# Patient Record
Sex: Female | Born: 1975 | Race: Black or African American | Hispanic: No | Marital: Married | Smoking: Never smoker
Health system: Southern US, Community
[De-identification: ages and names within clinical notes are randomized; demographics above are authoritative.]

## PROBLEM LIST (undated history)

## (undated) ENCOUNTER — Ambulatory Visit: Admission: EM | Payer: Self-pay

## (undated) ENCOUNTER — Ambulatory Visit (HOSPITAL_COMMUNITY): Admission: EM | Payer: BC Managed Care – PPO

## (undated) DIAGNOSIS — F419 Anxiety disorder, unspecified: Secondary | ICD-10-CM

## (undated) DIAGNOSIS — K219 Gastro-esophageal reflux disease without esophagitis: Secondary | ICD-10-CM

## (undated) DIAGNOSIS — J302 Other seasonal allergic rhinitis: Secondary | ICD-10-CM

## (undated) HISTORY — PX: NO PAST SURGERIES: SHX2092

---

## 2007-11-03 ENCOUNTER — Other Ambulatory Visit: Admission: RE | Admit: 2007-11-03 | Discharge: 2007-11-03 | Payer: Self-pay | Admitting: Obstetrics and Gynecology

## 2009-04-17 ENCOUNTER — Other Ambulatory Visit: Admission: RE | Admit: 2009-04-17 | Discharge: 2009-04-17 | Payer: Self-pay | Admitting: Obstetrics and Gynecology

## 2010-06-02 ENCOUNTER — Other Ambulatory Visit: Admission: RE | Admit: 2010-06-02 | Discharge: 2010-06-02 | Payer: Self-pay | Admitting: Obstetrics and Gynecology

## 2011-05-31 ENCOUNTER — Emergency Department (HOSPITAL_COMMUNITY): Payer: BC Managed Care – PPO

## 2011-05-31 ENCOUNTER — Emergency Department (HOSPITAL_COMMUNITY)
Admission: EM | Admit: 2011-05-31 | Discharge: 2011-05-31 | Disposition: A | Discharge status: Home / Self Care | Payer: BC Managed Care – PPO | Attending: Emergency Medicine | Admitting: Emergency Medicine

## 2011-05-31 DIAGNOSIS — E860 Dehydration: Secondary | ICD-10-CM | POA: Insufficient documentation

## 2011-05-31 DIAGNOSIS — R42 Dizziness and giddiness: Secondary | ICD-10-CM | POA: Insufficient documentation

## 2011-05-31 DIAGNOSIS — R51 Headache: Secondary | ICD-10-CM | POA: Insufficient documentation

## 2011-05-31 DIAGNOSIS — F411 Generalized anxiety disorder: Secondary | ICD-10-CM | POA: Insufficient documentation

## 2011-05-31 DIAGNOSIS — R5381 Other malaise: Secondary | ICD-10-CM | POA: Insufficient documentation

## 2011-05-31 LAB — URINE MICROSCOPIC-ADD ON

## 2011-05-31 LAB — POCT I-STAT, CHEM 8
BUN: 13 mg/dL (ref 6–23)
Calcium, Ion: 1.24 mmol/L (ref 1.12–1.32)
Glucose, Bld: 117 mg/dL — ABNORMAL HIGH (ref 70–99)
HCT: 45 % (ref 36.0–46.0)
TCO2: 25 mmol/L (ref 0–100)

## 2011-05-31 LAB — URINALYSIS, ROUTINE W REFLEX MICROSCOPIC
Hgb urine dipstick: NEGATIVE
Nitrite: NEGATIVE
Specific Gravity, Urine: 1.017 (ref 1.005–1.030)
Urobilinogen, UA: 1 mg/dL (ref 0.0–1.0)
pH: 7 (ref 5.0–8.0)

## 2011-11-17 ENCOUNTER — Other Ambulatory Visit (HOSPITAL_COMMUNITY)
Admission: RE | Admit: 2011-11-17 | Discharge: 2011-11-17 | Disposition: A | Discharge status: Home / Self Care | Payer: BC Managed Care – PPO | Source: Ambulatory Visit | Attending: Obstetrics and Gynecology | Admitting: Obstetrics and Gynecology

## 2011-11-17 DIAGNOSIS — Z01419 Encounter for gynecological examination (general) (routine) without abnormal findings: Secondary | ICD-10-CM | POA: Insufficient documentation

## 2015-11-11 ENCOUNTER — Other Ambulatory Visit (HOSPITAL_COMMUNITY)
Admission: RE | Admit: 2015-11-11 | Discharge: 2015-11-11 | Disposition: A | Discharge status: Home / Self Care | Payer: BC Managed Care – PPO | Source: Ambulatory Visit | Attending: Obstetrics and Gynecology | Admitting: Obstetrics and Gynecology

## 2015-11-11 ENCOUNTER — Other Ambulatory Visit: Payer: Self-pay | Admitting: Obstetrics and Gynecology

## 2015-11-11 DIAGNOSIS — Z1151 Encounter for screening for human papillomavirus (HPV): Secondary | ICD-10-CM | POA: Insufficient documentation

## 2015-11-11 DIAGNOSIS — R87810 Cervical high risk human papillomavirus (HPV) DNA test positive: Secondary | ICD-10-CM | POA: Diagnosis not present

## 2015-11-11 DIAGNOSIS — Z01411 Encounter for gynecological examination (general) (routine) with abnormal findings: Secondary | ICD-10-CM | POA: Insufficient documentation

## 2015-11-13 LAB — CYTOLOGY - PAP

## 2015-12-15 ENCOUNTER — Other Ambulatory Visit: Payer: Self-pay | Admitting: Obstetrics and Gynecology

## 2016-08-03 ENCOUNTER — Other Ambulatory Visit: Payer: Self-pay | Admitting: Obstetrics and Gynecology

## 2016-08-03 ENCOUNTER — Other Ambulatory Visit (HOSPITAL_COMMUNITY)
Admission: RE | Admit: 2016-08-03 | Discharge: 2016-08-03 | Disposition: A | Discharge status: Home / Self Care | Payer: BC Managed Care – PPO | Source: Ambulatory Visit | Attending: Obstetrics and Gynecology | Admitting: Obstetrics and Gynecology

## 2016-08-03 DIAGNOSIS — Z01419 Encounter for gynecological examination (general) (routine) without abnormal findings: Secondary | ICD-10-CM | POA: Diagnosis not present

## 2016-08-05 LAB — CYTOLOGY - PAP: DIAGNOSIS: NEGATIVE

## 2017-01-31 ENCOUNTER — Other Ambulatory Visit: Payer: Self-pay | Admitting: Obstetrics and Gynecology

## 2017-01-31 ENCOUNTER — Other Ambulatory Visit (HOSPITAL_COMMUNITY)
Admission: RE | Admit: 2017-01-31 | Discharge: 2017-01-31 | Disposition: A | Discharge status: Home / Self Care | Payer: BC Managed Care – PPO | Source: Ambulatory Visit | Attending: Obstetrics and Gynecology | Admitting: Obstetrics and Gynecology

## 2017-01-31 DIAGNOSIS — Z01419 Encounter for gynecological examination (general) (routine) without abnormal findings: Secondary | ICD-10-CM | POA: Insufficient documentation

## 2017-02-02 LAB — CYTOLOGY - PAP: DIAGNOSIS: NEGATIVE

## 2017-02-18 DIAGNOSIS — Z9109 Other allergy status, other than to drugs and biological substances: Secondary | ICD-10-CM | POA: Insufficient documentation

## 2017-03-01 ENCOUNTER — Other Ambulatory Visit: Payer: Self-pay | Admitting: Obstetrics and Gynecology

## 2017-03-01 DIAGNOSIS — Z1231 Encounter for screening mammogram for malignant neoplasm of breast: Secondary | ICD-10-CM

## 2017-03-22 ENCOUNTER — Ambulatory Visit: Payer: BC Managed Care – PPO

## 2017-03-22 ENCOUNTER — Ambulatory Visit
Admission: RE | Admit: 2017-03-22 | Discharge: 2017-03-22 | Disposition: A | Discharge status: Home / Self Care | Payer: BC Managed Care – PPO | Source: Ambulatory Visit | Attending: Obstetrics and Gynecology | Admitting: Obstetrics and Gynecology

## 2017-03-22 DIAGNOSIS — Z1231 Encounter for screening mammogram for malignant neoplasm of breast: Secondary | ICD-10-CM

## 2018-04-05 NOTE — Patient Instructions (Addendum)
Your procedure is scheduled on:  Wednesday, July 24  Enter through the [employer] [facility] at: 1 PM  Pick up the phone at the desk and dial [phone].  Call this number if you have problems the morning of surgery: [phone].  Remember: Do NOT eat food after midnight Tuesday.  Do NOT drink clear liquids (including water) after 8:30 am Wednesday, day of surgery.  Take these medicines the morning of surgery with a SIP OF WATER: None  Brush your teeth on the morning of surgery.  Stop herbal medications, vitamin supplements, Ibuprofen/NSAIDS 1 week prior to surgery.  Do NOT wear jewelry (body piercing), metal hair clips/bobby pins, make-up, or nail polish. Do NOT wear lotions, powders, or perfumes.  You may wear deoderant. Do NOT shave for 48 hours prior to surgery. Do NOT bring valuables to the hospital. Contacts may not be worn into surgery.  Have a responsible adult drive you home and stay with you for 24 hours after your procedure.  Home with Husband Yolanda Patel cell [phone] or Yolanda Patel cell [phone]

## 2018-04-13 ENCOUNTER — Other Ambulatory Visit: Payer: Self-pay | Admitting: Obstetrics and Gynecology

## 2018-04-14 ENCOUNTER — Encounter (HOSPITAL_COMMUNITY)
Admission: RE | Admit: 2018-04-14 | Discharge: 2018-04-14 | Disposition: A | Discharge status: Home / Self Care | Payer: BC Managed Care – PPO | Source: Ambulatory Visit | Attending: Obstetrics and Gynecology | Admitting: Obstetrics and Gynecology

## 2018-04-14 ENCOUNTER — Other Ambulatory Visit: Payer: Self-pay

## 2018-04-14 ENCOUNTER — Encounter (HOSPITAL_COMMUNITY): Payer: Self-pay

## 2018-04-14 DIAGNOSIS — Z01812 Encounter for preprocedural laboratory examination: Secondary | ICD-10-CM | POA: Diagnosis not present

## 2018-04-14 HISTORY — DX: Other seasonal allergic rhinitis: J30.2

## 2018-04-14 HISTORY — DX: Anxiety disorder, unspecified: F41.9

## 2018-04-14 HISTORY — DX: Gastro-esophageal reflux disease without esophagitis: K21.9

## 2018-04-14 LAB — CBC
HCT: 40.6 % (ref 36.0–46.0)
Hemoglobin: 13.6 g/dL (ref 12.0–15.0)
MCH: 30.3 pg (ref 26.0–34.0)
MCHC: 33.5 g/dL (ref 30.0–36.0)
MCV: 90.4 fL (ref 78.0–100.0)
PLATELETS: 358 10*3/uL (ref 150–400)
RBC: 4.49 MIL/uL (ref 3.87–5.11)
RDW: 13.9 % (ref 11.5–15.5)
WBC: 8.3 10*3/uL (ref 4.0–10.5)

## 2018-04-18 ENCOUNTER — Other Ambulatory Visit: Payer: Self-pay | Admitting: Obstetrics and Gynecology

## 2018-04-18 NOTE — H&P (Signed)
Chief Complaint(s):   complex left ovarian cyst   HPI:  General 42 y/o presents for preop history and physical exam in preparation for laproscopic left salpingoophorectomy due to complexe mass o the left ovary... ultrasound results. her uterus measures 10 cm x x 6 cm x 6 cm. she has a fundal fibroid 3.5 cm. The left adnexa contains a complex mass 8.3 cm vs 2 sepertie masses. low level inernal echoes 5.2 cm and 5.1 cm. they are avascular. ... She is interested in future fertility. Current Medication: Taking  Junel FE 1/20(Norethin Ace-Eth Estrad-FE) 1-20 MG-MCG Tablet take 1 tablet every day Orally Once a day   Not-Taking  Sprintec 28(Norgestimate-Eth Estradiol) 0.25-35 MG-MCG Tablet 1 tablet Orally Once a day     Allegra(Fexofenadine HCl) 1 tablet as needed, Notes: 180mg  prn     Flexeril 10 mg 30 10 mg Tablets one tablet orally every 8 hours prn pain     Mirena (52 MG)(Levonorgestrel) 20 MCG/24HR Intrauterine Device as directed Intrauterine     Nasacort AQ(Triamcinolone Acetonide) , Notes: as needed     Medication List reviewed and reconciled with the patient   Medical History:  UTI's      Allergies/Intolerance:  N.K.D.A.   Gyn History:  Sexual activity not currently sexually active. Periods : irregular. LMP 02/2018. Denies Birth control ocp-Junel . Last pap smear date 01/31/2017 -neg. Last mammogram date 03/22/2017 - normal . Denies Abnormal pap smear. Denies STD. Menarche 70.   OB History:  Number of pregnancies 2. Pregnancy # 1 1998, live birth, vaginal delivery, boy. Pregnancy # 2 2003, girl, vaginal delivery, live birth. NVD 2.   Surgical History:  No Surgical History documented.   Hospitalization:  childbirth x 2 SVD   Family History:  Father: alive    Mother: alive, Lupus, hypothyroid    Paternal [place] Father: diabetes, diagnosed with Diabetes    Son(s): alive, asthma    4 brother(s) , 6 sister(s) - healthy. 1 son(s) , 1 daughter(s) .    denies any GYN family  cancer hx.  Social History: General no EXPOSURE TO PASSIVE SMOKE.  no Alcohol.  Children: 1, son (s), 1, daughter (s).  no Caffeine.  EDUCATION: College.  Tobacco use cigarettes: Never smoked, Tobacco history last updated 04/06/2018.  Marital Status: married.  no Recreational drug use.  OCCUPATION: employed, [employer] logistics/support/administrative officer).   ROS: CONSTITUTIONAL No" options="no,yes" propid="91" itemid="193425" categoryid="10464" encounterid="[id number]"Chills No. No" options="no,yes" propid="91" itemid="172899" categoryid="10464" encounterid="[id number]"Fatigue No. No" options="no,yes" propid="91" itemid="10467" categoryid="10464" encounterid="[id number]"Fever No. No" options="no,yes" propid="91" itemid="193426" categoryid="10464" encounterid="[id number]"Night sweats No. No" options="no,yes" propid="91" itemid="444261" categoryid="10464" encounterid="[id number]"Recent travel outside [place] No. No" options="no,yes" propid="91" itemid="193427" categoryid="10464" encounterid="[id number]"Sweats No. No" options="no,yes" propid="91" itemid="194825" categoryid="10464" encounterid="[id number]"Weight change No.  OPHTHALMOLOGY no" options="no,yes" propid="91" itemid="12520" categoryid="12516" encounterid="[id number]"Blurring of vision no. no" options="no,yes" propid="91" itemid="193469" categoryid="12516" encounterid="[id number]"Change in vision no. no" options="no,yes" propid="91" itemid="194379" categoryid="12516" encounterid="[id number]"Double vision no.  ENT no" options="no,yes" propid="91" itemid="193612" categoryid="10481" encounterid="[id number]"Dizziness no. Nose bleeds no. Sore throat no. Teeth pain no.  ALLERGY no" options="no,yes" propid="91" itemid="202589" categoryid="138152" encounterid="[id number]"Hives no.  CARDIOLOGY no" options="no,yes" propid="91" itemid="193603" categoryid="10488" encounterid="[id number]"Chest pain no. no" options="no,yes" propid="91" itemid="199089" categoryid="10488"  encounterid="[id number]"High blood pressure no. no" options="no,yes" propid="91" itemid="202598" categoryid="10488" encounterid="[id number]"Irregular heart beat no. no" options="no,yes" propid="91" itemid="10491" categoryid="10488" encounterid="[id number]"Leg edema no. no" options="no,yes" propid="91" itemid="10490" categoryid="10488" encounterid="[id number]"Palpitations no.  RESPIRATORY no" options="no" propid="91" itemid="270013" categoryid="138132" encounterid="[id number]"Shortness of breath no. no" options="no,yes" propid="91" itemid="172745" categoryid="138132" encounterid="[id number]"Cough no. no" options="no,yes" propid="91" itemid="193621" categoryid="138132" encounterid="[id number]"Wheezing no.  UROLOGY no" options="no,yes" propid="91" FGHWEX="937169" categoryid="138166" encounterid="[id number]"Pain with urination  no. no" options="no,yes" propid="91" itemid="193493" categoryid="138166" encounterid="[id number]"Urinary urgency no. no" options="no,yes" propid="91" itemid="193492" categoryid="138166" encounterid="[id number]"Urinary frequency no. no" options="no,yes" propid="91" itemid="138171" categoryid="138166" encounterid="[id number]"Urinary incontinence no. No" options="no,yes" propid="91" itemid="138167" categoryid="138166" encounterid="[id number]"Difficulty urinating No. No" options="no,yes" propid="91" itemid="138168" categoryid="138166" encounterid="[id number]"Blood in urine No.  GASTROENTEROLOGY no" options="no,yes" propid="91" itemid="10496" categoryid="10494" encounterid="[id number]"Abdominal pain no. no" options="no,yes" propid="91" itemid="193447" categoryid="10494" encounterid="[id number]"Appetite change no. no" options="no,yes" propid="91" itemid="193448" categoryid="10494" encounterid="[id number]"Bloating/belching no. no" options="no,yes" propid="91" itemid="10503" categoryid="10494" encounterid="[id number]"Blood in stool or on toilet paper no. no" options="no,yes" propid="91" itemid="199106" categoryid="10494"  encounterid="[id number]"Change in bowel movements no. no" options="no,yes" propid="91" itemid="10501" categoryid="10494" encounterid="[id number]"Constipation no. no" options="no,yes" propid="91" itemid="10502" categoryid="10494" encounterid="[id number]"Diarrhea no. no" options="no,yes" propid="91" itemid="199104" categoryid="10494" encounterid="[id number]"Difficulty swallowing no. no" options="no,yes" propid="91" itemid="10499" categoryid="10494" encounterid="[id number]"Nausea no.  FEMALE REPRODUCTIVE no" options="no,yes" propid="91" itemid="453725" categoryid="10525" encounterid="[id number]"Vulvar pain no. no" options="no,yes" propid="91" itemid="453726" categoryid="10525" encounterid="[id number]"Vulvar rash no. no" options="no, yes" propid="91" itemid="444315" categoryid="10525" encounterid="[id number]"Abnormal vaginal bleeding no. no" options="no,yes" propid="91" itemid="186083" categoryid="10525" encounterid="[id number]"Breast pain no. no" options="no,yes" propid="91" itemid="186084" categoryid="10525" encounterid="[id number]"Nipple discharge no. no" options="no,yes" propid="91" itemid="275823" categoryid="10525" encounterid="[id number]"Pain with intercourse no. no" options="no,yes" propid="91" itemid="186082" categoryid="10525" encounterid="[id number]"Pelvic pain no. no" options="no,yes" propid="91" itemid="278230" categoryid="10525" encounterid="[id number]"Unusual vaginal discharge no. no" options="no,yes" propid="91" itemid="278942" categoryid="10525" encounterid="[id number]"Vaginal itching no.  MUSCULOSKELETAL no" options="no,yes" propid="91" itemid="193461" categoryid="10514" encounterid="[id number]"Muscle aches no.  NEUROLOGY no" options="no,yes" propid="91" itemid="12513" categoryid="12512" encounterid="[id number]"Headache no. no" options="no,yes" propid="91" itemid="12514" categoryid="12512" encounterid="[id number]"Tingling/numbness no. no" options="no,yes" propid="91" itemid="193468" categoryid="12512"  encounterid="[id number]"Weakness no.  PSYCHOLOGY no" options="" propid="91" itemid="275919" categoryid="10520" encounterid="[id number]"Depression no. no" options="no,yes" propid="91" itemid="172748" categoryid="10520" encounterid="[id number]"Anxiety no. no" options="no,yes" propid="91" itemid="199158" categoryid="10520" encounterid="10559303"Nervousness no. no" options="no,yes" propid="91" itemid="12502" categoryid="10520" encounterid="10559303"Sleep disturbances no. no " options="no,yes" propid="91" itemid="72718" categoryid="10520" encounterid="10559303"Suicidal ideation no .  ENDOCRINOLOGY no" options="no,yes" propid="91" itemid="194628" categoryid="12508" encounterid="10559303"Excessive thirst no. no" options="no,yes" propid="91" itemid="196285" categoryid="12508" encounterid="10559303"Excessive urination no. no" options="no, yes" propid="91" itemid="444314" categoryid="12508" encounterid="[id number]"Hair loss no. no" options="" propid="91" itemid="447284" categoryid="12508" encounterid="10559303"Heat or cold intolerance no.  HEMATOLOGY/LYMPH no" options="no,yes" propid="91" itemid="199152" categoryid="138157" encounterid="10559303"Abnormal bleeding no. no" options="no,yes" propid="91" itemid="170653" categoryid="138157" encounterid="10559303"Easy bruising no. no" options="no,yes" propid="91" itemid="138158" categoryid="138157" encounterid="10559303"Swollen glands no.  DERMATOLOGY no" options="no,yes" propid="91" itemid="199126" categoryid="12503" encounterid="10559303"New/changing skin lesion no. no" options="no,yes" propid="91" itemid="12504" categoryid="12503" encounterid="[id number]"Rash no. no" options="" propid="91" itemid="444313" categoryid="12503" encounterid="[id number]"Sores no.   Negative except as stated in HPI.  Objective: Vitals: Wt 159, Wt change -6 lb, Ht 62.5, BMI 28.61, Pulse sitting 90, BP sitting 102/60  Past Results: Examination:  General Examination alert, oriented, NAD "  categoryPropId="10089" examid="193638"CONSTITUTIONAL: alert, oriented, NAD .  moist, warm" categoryPropId="10109" examid="193638"SKIN: moist, warm.  Conjunctiva clear" categoryPropId="21468" examid="193638"EYES: Conjunctiva clear.  clear to auscultation bilaterally" categoryPropId="87" examid="193638"LUNGS: clear to auscultation bilaterally.  regular rate and rhythm" categoryPropId="86" examid="193638"HEART: regular rate and rhythm.  soft, non-tender/non-distended, bowel sounds present " categoryPropId="88" examid="193638"ABDOMEN: soft, non-tender/non-distended, bowel sounds present .  normal external genitalia, labia - unremarkable, vagina - pink moist mucosa, no lesions or abnormal discharge, cervix - no discharge or lesions or CMT, adnexa - no masses or tenderness, uterus - nontender and normal size on palpation " categoryPropId="13414" examid="193638"FEMALE GENITOURINARY: normal external genitalia, labia - unremarkable, vagina - pink moist mucosa, no lesions or abnormal discharge, cervix - no discharge or lesions or CMT, adnexa - no masses or tenderness, uterus - nontender and normal size on palpation .  affect normal, good eye contact" categoryPropId="16316" examid="193638"PSYCH: affect normal, good eye contact.  Physical Examination:    Assessment: Assessment:  Ovarian cyst - N83.209 (Primary)     Plan: Treatment: Ovarian cyst Notes: large complex ovarian cyst... d/w pt r/b/a of laparoscpic left salpingoophorectomy .. including but not limited to infection bleeding damage to bowel bladder ureters  with the need for further surgery. pt voiced understanding and desires to proceed.Marland Kitchen

## 2018-04-18 NOTE — H&P (Deleted)
  The note originally documented on this encounter has been moved the the encounter in which it belongs.  

## 2018-04-19 ENCOUNTER — Encounter (HOSPITAL_COMMUNITY): Payer: Self-pay

## 2018-04-19 ENCOUNTER — Ambulatory Visit (HOSPITAL_COMMUNITY): Payer: BC Managed Care – PPO | Admitting: Anesthesiology

## 2018-04-19 ENCOUNTER — Ambulatory Visit (HOSPITAL_COMMUNITY)
Admission: AD | Admit: 2018-04-19 | Discharge: 2018-04-19 | Disposition: A | Discharge status: Home / Self Care | Payer: BC Managed Care – PPO | Source: Ambulatory Visit | Attending: Obstetrics and Gynecology | Admitting: Obstetrics and Gynecology

## 2018-04-19 ENCOUNTER — Encounter (HOSPITAL_COMMUNITY)
Admission: AD | Disposition: A | Discharge status: Home / Self Care | Payer: Self-pay | Source: Ambulatory Visit | Attending: Obstetrics and Gynecology

## 2018-04-19 ENCOUNTER — Other Ambulatory Visit: Payer: Self-pay

## 2018-04-19 DIAGNOSIS — Z7951 Long term (current) use of inhaled steroids: Secondary | ICD-10-CM | POA: Diagnosis not present

## 2018-04-19 DIAGNOSIS — D271 Benign neoplasm of left ovary: Secondary | ICD-10-CM | POA: Diagnosis not present

## 2018-04-19 DIAGNOSIS — Z79899 Other long term (current) drug therapy: Secondary | ICD-10-CM | POA: Insufficient documentation

## 2018-04-19 DIAGNOSIS — N838 Other noninflammatory disorders of ovary, fallopian tube and broad ligament: Secondary | ICD-10-CM | POA: Diagnosis present

## 2018-04-19 DIAGNOSIS — Z793 Long term (current) use of hormonal contraceptives: Secondary | ICD-10-CM | POA: Diagnosis not present

## 2018-04-19 DIAGNOSIS — N839 Noninflammatory disorder of ovary, fallopian tube and broad ligament, unspecified: Secondary | ICD-10-CM | POA: Diagnosis present

## 2018-04-19 HISTORY — PX: LAPAROSCOPIC SALPINGO OOPHERECTOMY: SHX5927

## 2018-04-19 LAB — PREGNANCY, URINE: Preg Test, Ur: NEGATIVE

## 2018-04-19 SURGERY — SALPINGO-OOPHORECTOMY, LAPAROSCOPIC
Anesthesia: General | Laterality: Left

## 2018-04-19 MED ORDER — PROPOFOL 10 MG/ML IV BOLUS
INTRAVENOUS | Status: AC
  Filled 2018-04-19: qty 20

## 2018-04-19 MED ORDER — LIDOCAINE HCL (CARDIAC) PF 100 MG/5ML IV SOSY
PREFILLED_SYRINGE | INTRAVENOUS | Status: AC
  Filled 2018-04-19: qty 5

## 2018-04-19 MED ORDER — LACTATED RINGERS IV SOLN
INTRAVENOUS | Status: DC | PRN
  Administered 2018-04-19 (×2): via INTRAVENOUS

## 2018-04-19 MED ORDER — FENTANYL CITRATE (PF) 250 MCG/5ML IJ SOLN
INTRAMUSCULAR | Status: AC
  Filled 2018-04-19: qty 5

## 2018-04-19 MED ORDER — KETOROLAC TROMETHAMINE 30 MG/ML IJ SOLN
INTRAMUSCULAR | Status: DC | PRN
  Administered 2018-04-19: 30 mg via INTRAVENOUS

## 2018-04-19 MED ORDER — LACTATED RINGERS IV SOLN
INTRAVENOUS | Status: DC
  Administered 2018-04-19: 125 mL/h via INTRAVENOUS

## 2018-04-19 MED ORDER — BUPIVACAINE HCL (PF) 0.25 % IJ SOLN
INTRAMUSCULAR | Status: DC | PRN
  Administered 2018-04-19: 30 mL

## 2018-04-19 MED ORDER — OXYCODONE HCL 5 MG PO TABS: 5.0000 mg | ORAL_TABLET | Freq: Once | ORAL | Status: DC | PRN

## 2018-04-19 MED ORDER — PHENYLEPHRINE 40 MCG/ML (10ML) SYRINGE FOR IV PUSH (FOR BLOOD PRESSURE SUPPORT)
PREFILLED_SYRINGE | INTRAVENOUS | Status: DC | PRN
  Administered 2018-04-19: 40 ug via INTRAVENOUS
  Administered 2018-04-19 (×2): 80 ug via INTRAVENOUS
  Administered 2018-04-19 (×2): 40 ug via INTRAVENOUS
  Administered 2018-04-19: 80 ug via INTRAVENOUS
  Administered 2018-04-19 (×3): 40 ug via INTRAVENOUS
  Administered 2018-04-19: 80 ug via INTRAVENOUS

## 2018-04-19 MED ORDER — CEFAZOLIN SODIUM-DEXTROSE 2-4 GM/100ML-% IV SOLN
INTRAVENOUS | Status: AC
  Filled 2018-04-19: qty 100

## 2018-04-19 MED ORDER — DEXAMETHASONE SODIUM PHOSPHATE 10 MG/ML IJ SOLN
INTRAMUSCULAR | Status: DC | PRN
  Administered 2018-04-19: 10 mg via INTRAVENOUS

## 2018-04-19 MED ORDER — MIDAZOLAM HCL 2 MG/2ML IJ SOLN
INTRAMUSCULAR | Status: DC | PRN
  Administered 2018-04-19: 2 mg via INTRAVENOUS

## 2018-04-19 MED ORDER — PHENYLEPHRINE 40 MCG/ML (10ML) SYRINGE FOR IV PUSH (FOR BLOOD PRESSURE SUPPORT)
PREFILLED_SYRINGE | INTRAVENOUS | Status: AC
  Filled 2018-04-19: qty 10

## 2018-04-19 MED ORDER — MIDAZOLAM HCL 2 MG/2ML IJ SOLN
INTRAMUSCULAR | Status: AC
  Filled 2018-04-19: qty 2

## 2018-04-19 MED ORDER — ONDANSETRON HCL 4 MG/2ML IJ SOLN: 4.0000 mg | Freq: Four times a day (QID) | INTRAMUSCULAR | Status: DC | PRN

## 2018-04-19 MED ORDER — ROCURONIUM BROMIDE 100 MG/10ML IV SOLN
INTRAVENOUS | Status: DC | PRN
  Administered 2018-04-19: 50 mg via INTRAVENOUS

## 2018-04-19 MED ORDER — SCOPOLAMINE 1 MG/3DAYS TD PT72
MEDICATED_PATCH | TRANSDERMAL | Status: AC
  Administered 2018-04-19: 1.5 mg via TRANSDERMAL
  Filled 2018-04-19: qty 1

## 2018-04-19 MED ORDER — PROPOFOL 10 MG/ML IV BOLUS
INTRAVENOUS | Status: DC | PRN
  Administered 2018-04-19: 200 mg via INTRAVENOUS
  Administered 2018-04-19: 30 mg via INTRAVENOUS

## 2018-04-19 MED ORDER — OXYCODONE HCL 5 MG/5ML PO SOLN: 5.0000 mg | Freq: Once | ORAL | Status: DC | PRN

## 2018-04-19 MED ORDER — FENTANYL CITRATE (PF) 100 MCG/2ML IJ SOLN
INTRAMUSCULAR | Status: DC | PRN
  Administered 2018-04-19: 50 ug via INTRAVENOUS
  Administered 2018-04-19: 100 ug via INTRAVENOUS
  Administered 2018-04-19: 50 ug via INTRAVENOUS

## 2018-04-19 MED ORDER — BUPIVACAINE HCL (PF) 0.25 % IJ SOLN
INTRAMUSCULAR | Status: AC
  Filled 2018-04-19: qty 30

## 2018-04-19 MED ORDER — KETOROLAC TROMETHAMINE 30 MG/ML IJ SOLN
INTRAMUSCULAR | Status: AC
  Filled 2018-04-19: qty 1

## 2018-04-19 MED ORDER — ONDANSETRON HCL 4 MG/2ML IJ SOLN
INTRAMUSCULAR | Status: AC
  Filled 2018-04-19: qty 2

## 2018-04-19 MED ORDER — HYDROCODONE-ACETAMINOPHEN 5-325 MG PO TABS: 1.0000 | ORAL_TABLET | Freq: Four times a day (QID) | ORAL | 0 refills | Status: DC | PRN

## 2018-04-19 MED ORDER — ONDANSETRON HCL 4 MG/2ML IJ SOLN
INTRAMUSCULAR | Status: DC | PRN
  Administered 2018-04-19: 4 mg via INTRAVENOUS

## 2018-04-19 MED ORDER — FENTANYL CITRATE (PF) 100 MCG/2ML IJ SOLN
INTRAMUSCULAR | Status: AC
  Filled 2018-04-19: qty 2

## 2018-04-19 MED ORDER — CEFAZOLIN SODIUM-DEXTROSE 2-4 GM/100ML-% IV SOLN
2.0000 g | INTRAVENOUS | Status: AC
  Administered 2018-04-19: 2 g via INTRAVENOUS

## 2018-04-19 MED ORDER — SUGAMMADEX SODIUM 200 MG/2ML IV SOLN
INTRAVENOUS | Status: DC | PRN
  Administered 2018-04-19: 200 mg via INTRAVENOUS

## 2018-04-19 MED ORDER — IBUPROFEN 800 MG PO TABS: 800.0000 mg | ORAL_TABLET | Freq: Three times a day (TID) | ORAL | 0 refills | Status: DC | PRN

## 2018-04-19 MED ORDER — SCOPOLAMINE 1 MG/3DAYS TD PT72
1.0000 | MEDICATED_PATCH | Freq: Once | TRANSDERMAL | Status: DC
  Administered 2018-04-19: 1.5 mg via TRANSDERMAL

## 2018-04-19 MED ORDER — LIDOCAINE HCL (CARDIAC) PF 100 MG/5ML IV SOSY
PREFILLED_SYRINGE | INTRAVENOUS | Status: DC | PRN
  Administered 2018-04-19: 60 mg via INTRAVENOUS

## 2018-04-19 MED ORDER — FENTANYL CITRATE (PF) 100 MCG/2ML IJ SOLN
25.0000 ug | INTRAMUSCULAR | Status: DC | PRN
  Administered 2018-04-19: 50 ug via INTRAVENOUS

## 2018-04-19 SURGICAL SUPPLY — 28 items
CABLE HIGH FREQUENCY MONO STRZ (ELECTRODE) IMPLANT
DERMABOND ADHESIVE PROPEN (GAUZE/BANDAGES/DRESSINGS) ×1
DERMABOND ADVANCED .7 DNX6 (GAUZE/BANDAGES/DRESSINGS) ×1 IMPLANT
DRSG OPSITE POSTOP 3X4 (GAUZE/BANDAGES/DRESSINGS) ×2 IMPLANT
DURAPREP 26ML APPLICATOR (WOUND CARE) ×2 IMPLANT
GLOVE BIOGEL M 6.5 STRL (GLOVE) ×4 IMPLANT
GLOVE BIOGEL PI IND STRL 6.5 (GLOVE) ×1 IMPLANT
GLOVE BIOGEL PI IND STRL 7.0 (GLOVE) ×3 IMPLANT
GLOVE BIOGEL PI INDICATOR 6.5 (GLOVE) ×1
GLOVE BIOGEL PI INDICATOR 7.0 (GLOVE) ×3
GOWN STRL REUS W/TWL LRG LVL3 (GOWN DISPOSABLE) ×4 IMPLANT
NS IRRIG 1000ML POUR BTL (IV SOLUTION) ×2 IMPLANT
PACK LAPAROSCOPY BASIN (CUSTOM PROCEDURE TRAY) ×2 IMPLANT
PACK TRENDGUARD 450 HYBRID PRO (MISCELLANEOUS) ×1 IMPLANT
POUCH SPECIMEN RETRIEVAL 10MM (ENDOMECHANICALS) ×2 IMPLANT
PROTECTOR NERVE ULNAR (MISCELLANEOUS) ×4 IMPLANT
SET IRRIG TUBING LAPAROSCOPIC (IRRIGATION / IRRIGATOR) ×2 IMPLANT
SHEARS HARMONIC ACE PLUS 36CM (ENDOMECHANICALS) ×2 IMPLANT
SOLUTION ELECTROLUBE (MISCELLANEOUS) ×2 IMPLANT
SUT VIC AB 4-0 PS2 27 (SUTURE) ×2 IMPLANT
SUT VICRYL 0 UR6 27IN ABS (SUTURE) ×2 IMPLANT
TOWEL OR 17X24 6PK STRL BLUE (TOWEL DISPOSABLE) ×4 IMPLANT
TRAY FOLEY W/BAG SLVR 14FR (SET/KITS/TRAYS/PACK) ×2 IMPLANT
TRENDGUARD 450 HYBRID PRO PACK (MISCELLANEOUS) ×2
TROCAR XCEL NON-BLD 11X100MML (ENDOMECHANICALS) ×2 IMPLANT
TROCAR XCEL NON-BLD 5MMX100MML (ENDOMECHANICALS) ×2 IMPLANT
TUBING INSUF HEATED (TUBING) ×2 IMPLANT
WARMER LAPAROSCOPE (MISCELLANEOUS) ×2 IMPLANT

## 2018-04-19 NOTE — Op Note (Signed)
04/19/2018  4:01 PM  PATIENT:  Yolanda Patel  42 y.o. female  PRE-OPERATIVE DIAGNOSIS:  N94.9 Adnexal Mass  POST-OPERATIVE DIAGNOSIS:  N94.9 Adnexal Mass  PROCEDURE:  Procedure(s): LAPAROSCOPIC LEFT SALPINGO OOPHORECTOMY (Left)  SURGEON:  Surgeon(s) and Role:    Christophe Louis, MD - Primary    * Thurnell Lose, MD - Assisting  PHYSICIAN ASSISTANT:   ASSISTANTS: Dr. Simona Huh assisted due to the complexity of the surgery   ANESTHESIA:   general  EBL:  100 mL   BLOOD ADMINISTERED:none  DRAINS: Urinary Catheter (Foley)   LOCAL MEDICATIONS USED:  MARCAINE     SPECIMEN:  Source of Specimen:  left fallopian tube and ovary with mass   DISPOSITION OF SPECIMEN:  PATHOLOGY  COUNTS:  YES  TOURNIQUET:  * No tourniquets in log *  DICTATION: .Dragon Dictation  PLAN OF CARE: Discharge to home after PACU  PATIENT DISPOSITION:  PACU - hemodynamically stable.   Delay start of Pharmacological VTE agent (>24hrs) due to surgical blood loss or risk of bleeding: not applicable  Procedure: Procedure: the patient was taken to the operating room placed under general anesthesia. Time Out was performed.  She was  Prepped and draped in the normal sterile fashion. A foley catheter was placed. A uterine manipulator was placed. Attention was turned to the abdomen where the umbilicus was injected with 10 cc of marcaine. A 10 mm trocar was placed under direct visualization. Pneumoperitoneum was achieved with C02 gas... A 5 mm trocar was placed in the right and left lower quadrants. Each trocar site was injected with 10 cc of marcaine prior to trocar placement. Pelvic washing were obtained.  The harmonic scalpel was used to incise along the left infundibulopelvic ligament   And the left utero-ovarian ligament.  An endo catch bag was placed through the 10 mm umbilical port. The specimen was placed in the bag and removed through the umbilical incision. Pneumoperitoneum was reestablished. Pt was noted to have  bleeding from the left pelvic side wall and utero ovarian ligament. this was controlled with the Kleppinger.  The pelvis was irrigated. Arista was placed along the left pelvic side wale  for additional hemostasis. Excellent hemostasis was noted. All trocars were removed under direct visualization . The pneumoperitoneum was released.  The fascia of the umbilical incision was re approximated with 0 vicryl. The 5 mm  skin incisions were closed with 4-0 vicryl and derma bond.  the patient was taken to the recovery room awake and in stable condition.  Sponge lap and needle counts were correct times 2.    Findings: complex left ovarian cyst with components compatible with dermoid ( fat and hair noted)... Normal left fallopian tube.. Normal right fallopian tube and ovary.

## 2018-04-19 NOTE — Transfer of Care (Signed)
Immediate Anesthesia Transfer of Care Note  Patient: Yolanda Patel  Procedure(s) Performed: LAPAROSCOPIC LEFT SALPINGO OOPHORECTOMY (Left )  Patient Location: PACU  Anesthesia Type:General  Level of Consciousness: sedated  Airway & Oxygen Therapy: Patient Spontanous Breathing and Patient connected to nasal cannula oxygen  Post-op Assessment: Report given to RN  Post vital signs: Reviewed and stable  Last Vitals:  Vitals Value Taken Time  BP 128/66 04/19/2018  3:49 PM  Temp 37 C 04/19/2018  3:45 PM  Pulse 80 04/19/2018  3:54 PM  Resp 21 04/19/2018  3:54 PM  SpO2 100 % 04/19/2018  3:54 PM  Vitals shown include unvalidated device data.  Last Pain:  Vitals:   04/19/18 1257  TempSrc: Oral      Patients Stated Pain Goal: 4 (38/75/64 3329)  Complications: No apparent anesthesia complications

## 2018-04-19 NOTE — Anesthesia Preprocedure Evaluation (Signed)
Anesthesia Evaluation  Patient identified by MRN, date of birth, ID band Patient awake    Reviewed: Allergy & Precautions, H&P , NPO status , Patient's Chart, lab work & pertinent test results  Airway Mallampati: II   Neck ROM: full    Dental   Pulmonary neg pulmonary ROS,    breath sounds clear to auscultation       Cardiovascular negative cardio ROS   Rhythm:regular Rate:Normal     Neuro/Psych PSYCHIATRIC DISORDERS Anxiety    GI/Hepatic GERD  ,  Endo/Other    Renal/GU      Musculoskeletal   Abdominal   Peds  Hematology   Anesthesia Other Findings   Reproductive/Obstetrics Adnexal mass                             Anesthesia Physical Anesthesia Plan  ASA: II  Anesthesia Plan: General   Post-op Pain Management:    Induction: Intravenous  PONV Risk Score and Plan: 3 and Ondansetron, Dexamethasone, Midazolam, Scopolamine patch - Pre-op and Treatment may vary due to age or medical condition  Airway Management Planned: Oral ETT  Additional Equipment:   Intra-op Plan:   Post-operative Plan: Extubation in OR  Informed Consent: I have reviewed the patients History and Physical, chart, labs and discussed the procedure including the risks, benefits and alternatives for the proposed anesthesia with the patient or authorized representative who has indicated his/her understanding and acceptance.     Plan Discussed with: CRNA, Surgeon and Anesthesiologist  Anesthesia Plan Comments:         Anesthesia Quick Evaluation

## 2018-04-19 NOTE — Anesthesia Procedure Notes (Signed)
Procedure Name: Intubation Date/Time: 04/19/2018 2:06 PM Performed by: Asher Muir, CRNA Pre-anesthesia Checklist: Patient identified, Emergency Drugs available, Suction available and Patient being monitored Patient Re-evaluated:Patient Re-evaluated prior to induction Oxygen Delivery Method: Circle system utilized and Simple face mask Preoxygenation: Pre-oxygenation with 100% oxygen Induction Type: IV induction Ventilation: Mask ventilation without difficulty Laryngoscope Size: Mac and 3 Grade View: Grade II Tube type: Oral Tube size: 7.0 mm Number of attempts: 1 Airway Equipment and Method: Stylet Placement Confirmation: ETT inserted through vocal cords under direct vision,  positive ETCO2 and breath sounds checked- equal and bilateral Secured at: 20 (right lip) cm Tube secured with: Tape Dental Injury: Teeth and Oropharynx as per pre-operative assessment

## 2018-04-19 NOTE — H&P (Signed)
Date of Initial H&P:04/18/2018 History reviewed, patient examined, no change in status, stable for surgery.

## 2018-04-19 NOTE — Discharge Instructions (Signed)

## 2018-04-20 ENCOUNTER — Encounter (HOSPITAL_COMMUNITY): Payer: Self-pay | Admitting: Obstetrics and Gynecology

## 2018-04-20 NOTE — Anesthesia Postprocedure Evaluation (Signed)
Anesthesia Post Note  Patient: Yolanda Patel  Procedure(s) Performed: LAPAROSCOPIC LEFT SALPINGO OOPHORECTOMY (Left )     Patient location during evaluation: PACU Anesthesia Type: General Level of consciousness: awake and alert Pain management: pain level controlled Vital Signs Assessment: post-procedure vital signs reviewed and stable Respiratory status: spontaneous breathing, nonlabored ventilation, respiratory function stable and patient connected to nasal cannula oxygen Cardiovascular status: blood pressure returned to baseline and stable Postop Assessment: no apparent nausea or vomiting Anesthetic complications: no    Last Vitals:  Vitals:   04/19/18 1715 04/19/18 1740  BP:  118/74  Pulse:  86  Resp:  16  Temp:  36.8 C  SpO2: 100% 100%    Last Pain:  Vitals:   04/19/18 1740  TempSrc:   PainSc: 2                  Gianluca Chhim S

## 2018-05-05 ENCOUNTER — Other Ambulatory Visit: Payer: Self-pay | Admitting: Obstetrics and Gynecology

## 2018-05-05 DIAGNOSIS — Z1231 Encounter for screening mammogram for malignant neoplasm of breast: Secondary | ICD-10-CM

## 2018-06-01 ENCOUNTER — Ambulatory Visit
Admission: RE | Admit: 2018-06-01 | Discharge: 2018-06-01 | Disposition: A | Discharge status: Home / Self Care | Payer: BC Managed Care – PPO | Source: Ambulatory Visit | Attending: Obstetrics and Gynecology | Admitting: Obstetrics and Gynecology

## 2018-06-01 DIAGNOSIS — Z1231 Encounter for screening mammogram for malignant neoplasm of breast: Secondary | ICD-10-CM

## 2019-04-10 ENCOUNTER — Other Ambulatory Visit: Payer: Self-pay | Admitting: Obstetrics and Gynecology

## 2019-04-10 ENCOUNTER — Other Ambulatory Visit (HOSPITAL_COMMUNITY)
Admission: RE | Admit: 2019-04-10 | Discharge: 2019-04-10 | Disposition: A | Discharge status: Home / Self Care | Payer: BC Managed Care – PPO | Source: Ambulatory Visit | Attending: Obstetrics and Gynecology | Admitting: Obstetrics and Gynecology

## 2019-04-10 DIAGNOSIS — Z01419 Encounter for gynecological examination (general) (routine) without abnormal findings: Secondary | ICD-10-CM | POA: Diagnosis present

## 2019-04-12 LAB — CYTOLOGY - PAP
Diagnosis: NEGATIVE
HPV: NOT DETECTED

## 2019-04-23 ENCOUNTER — Other Ambulatory Visit: Payer: Self-pay | Admitting: Obstetrics and Gynecology

## 2019-04-23 DIAGNOSIS — Z1231 Encounter for screening mammogram for malignant neoplasm of breast: Secondary | ICD-10-CM

## 2019-04-26 ENCOUNTER — Other Ambulatory Visit: Payer: Self-pay

## 2019-04-26 DIAGNOSIS — Z20822 Contact with and (suspected) exposure to covid-19: Secondary | ICD-10-CM

## 2019-04-29 LAB — NOVEL CORONAVIRUS, NAA: SARS-CoV-2, NAA: NOT DETECTED

## 2019-06-07 ENCOUNTER — Other Ambulatory Visit: Payer: Self-pay

## 2019-06-07 ENCOUNTER — Ambulatory Visit
Admission: RE | Admit: 2019-06-07 | Discharge: 2019-06-07 | Disposition: A | Discharge status: Home / Self Care | Payer: BC Managed Care – PPO | Source: Ambulatory Visit | Attending: Obstetrics and Gynecology | Admitting: Obstetrics and Gynecology

## 2019-06-07 DIAGNOSIS — Z1231 Encounter for screening mammogram for malignant neoplasm of breast: Secondary | ICD-10-CM

## 2020-07-29 ENCOUNTER — Other Ambulatory Visit: Payer: Self-pay | Admitting: Obstetrics and Gynecology

## 2020-07-29 DIAGNOSIS — Z1231 Encounter for screening mammogram for malignant neoplasm of breast: Secondary | ICD-10-CM

## 2020-08-01 ENCOUNTER — Other Ambulatory Visit: Payer: Self-pay

## 2020-08-01 ENCOUNTER — Ambulatory Visit
Admission: RE | Admit: 2020-08-01 | Discharge: 2020-08-01 | Disposition: A | Discharge status: Home / Self Care | Payer: BC Managed Care – PPO | Source: Ambulatory Visit

## 2020-08-01 DIAGNOSIS — Z1231 Encounter for screening mammogram for malignant neoplasm of breast: Secondary | ICD-10-CM

## 2020-11-15 DIAGNOSIS — Z0000 Encounter for general adult medical examination without abnormal findings: Secondary | ICD-10-CM | POA: Insufficient documentation

## 2021-05-22 ENCOUNTER — Other Ambulatory Visit: Payer: Self-pay

## 2021-05-22 ENCOUNTER — Emergency Department (HOSPITAL_COMMUNITY)
Admission: EM | Admit: 2021-05-22 | Discharge: 2021-05-23 | Disposition: A | Discharge status: Home / Self Care | Payer: BC Managed Care – PPO | Attending: Emergency Medicine | Admitting: Emergency Medicine

## 2021-05-22 ENCOUNTER — Emergency Department (HOSPITAL_COMMUNITY): Payer: BC Managed Care – PPO

## 2021-05-22 ENCOUNTER — Encounter (HOSPITAL_COMMUNITY): Payer: Self-pay | Admitting: Emergency Medicine

## 2021-05-22 DIAGNOSIS — M25511 Pain in right shoulder: Secondary | ICD-10-CM | POA: Insufficient documentation

## 2021-05-22 DIAGNOSIS — M542 Cervicalgia: Secondary | ICD-10-CM | POA: Insufficient documentation

## 2021-05-22 DIAGNOSIS — R200 Anesthesia of skin: Secondary | ICD-10-CM | POA: Insufficient documentation

## 2021-05-22 DIAGNOSIS — M5412 Radiculopathy, cervical region: Secondary | ICD-10-CM

## 2021-05-22 DIAGNOSIS — R202 Paresthesia of skin: Secondary | ICD-10-CM

## 2021-05-22 NOTE — ED Triage Notes (Signed)
On August 5th, pt was having her hair done and heard a pop that came from her right shoulder area.  Pt states since then she has had pain in right arm/shoulder that goes to her shoulder blade.  Pt also reports pain and numbness when she lays on her right side.  No neuro deficits at this time. "Hurts more when it is pressed." Pt is anxious in triage.

## 2021-05-23 ENCOUNTER — Emergency Department (HOSPITAL_COMMUNITY): Payer: BC Managed Care – PPO

## 2021-05-23 ENCOUNTER — Encounter (HOSPITAL_COMMUNITY): Payer: Self-pay | Admitting: Radiology

## 2021-05-23 LAB — BASIC METABOLIC PANEL
Anion gap: 8 (ref 5–15)
BUN: 16 mg/dL (ref 6–20)
CO2: 22 mmol/L (ref 22–32)
Calcium: 9.1 mg/dL (ref 8.9–10.3)
Chloride: 107 mmol/L (ref 98–111)
Creatinine, Ser: 0.99 mg/dL (ref 0.44–1.00)
GFR, Estimated: >60 mL/min (ref >60)
Glucose, Bld: 104 mg/dL — ABNORMAL HIGH (ref 70–99)
Potassium: 4.1 mmol/L (ref 3.5–5.1)
Sodium: 137 mmol/L (ref 135–145)

## 2021-05-23 LAB — CBC WITH DIFFERENTIAL/PLATELET
Abs Immature Granulocytes: 0.03 10*3/uL (ref 0.00–0.07)
Basophils Absolute: 0.1 10*3/uL (ref 0.0–0.1)
Basophils Relative: 0 %
Eosinophils Absolute: 0.4 10*3/uL (ref 0.0–0.5)
Eosinophils Relative: 4 %
HCT: 40.9 % (ref 36.0–46.0)
Hemoglobin: 13.4 g/dL (ref 12.0–15.0)
Immature Granulocytes: 0 %
Lymphocytes Relative: 39 %
Lymphs Abs: 4.4 10*3/uL — ABNORMAL HIGH (ref 0.7–4.0)
MCH: 30 pg (ref 26.0–34.0)
MCHC: 32.8 g/dL (ref 30.0–36.0)
MCV: 91.7 fL (ref 80.0–100.0)
Monocytes Absolute: 0.9 10*3/uL (ref 0.1–1.0)
Monocytes Relative: 8 %
Neutro Abs: 5.5 10*3/uL (ref 1.7–7.7)
Neutrophils Relative %: 49 %
Platelets: 331 10*3/uL (ref 150–400)
RBC: 4.46 MIL/uL (ref 3.87–5.11)
RDW: 14 % (ref 11.5–15.5)
WBC: 11.1 10*3/uL — ABNORMAL HIGH (ref 4.0–10.5)
nRBC: 0 % (ref 0.0–0.2)

## 2021-05-23 LAB — MAGNESIUM: Magnesium: 2.1 mg/dL (ref 1.7–2.4)

## 2021-05-23 LAB — POC URINE PREG, ED: Preg Test, Ur: NEGATIVE

## 2021-05-23 MED ORDER — SODIUM CHLORIDE 0.9 % IV BOLUS
1000.0000 mL | Freq: Once | INTRAVENOUS | Status: AC
  Administered 2021-05-23: 1000 mL via INTRAVENOUS

## 2021-05-23 MED ORDER — IOHEXOL 350 MG/ML SOLN
75.0000 mL | Freq: Once | INTRAVENOUS | Status: AC | PRN
  Administered 2021-05-23: 75 mL via INTRAVENOUS

## 2021-05-23 NOTE — ED Provider Notes (Signed)
[facility] EMERGENCY DEPARTMENT Provider Note   CSN: [id number] Arrival date & time: 05/22/21  2210     History Chief Complaint  Patient presents with   Numbness   Shoulder Pain    Yolanda Patel is a 45 y.o. female.  HPI 45 year old female presents with right neck pain and right face and arm numbness.  She states this originally started when she was getting her hair done earlier this month.  She had her neck twisted at an awkward angle and felt a pop.  Since then she has noticed on and off numbness in her right face and right arm that goes to her elbow.  Seems to come and go, most often with certain movements like bending her neck to the right.  There is no associated headache.  She has also been noticing some increased orbs floating in her vision in the bilateral lower lateral quadrants.  This comes and goes and not always with the numbness.  Seem to be worsening so she came in for evaluation.  Right now her neck does not hurt and she has no current numbness.  She has not had any weakness.  Past Medical History:  Diagnosis Date   Anxiety    panic attacks - no med   GERD (gastroesophageal reflux disease)    otc med - tums prn   Seasonal allergies    SVD (spontaneous vaginal delivery)    x 2 - both in [place]    Patient Active Problem List   Diagnosis Date Noted   Ovarian mass, left 04/19/2018    Past Surgical History:  Procedure Laterality Date   LAPAROSCOPIC SALPINGO OOPHERECTOMY Left 04/19/2018   Procedure: LAPAROSCOPIC LEFT SALPINGO OOPHORECTOMY;  Surgeon: Christophe Louis, MD;  Location: [facility];  Service: Gynecology;  Laterality: Left;   NO PAST SURGERIES       OB History   No obstetric history on file.     No family history on file.  Social History   Tobacco Use   Smoking status: Never   Smokeless tobacco: Never  Vaping Use   Vaping Use: Never used  Substance Use Topics   Alcohol use: Yes    Comment: occasional   Drug use: Never    Home  Medications Prior to Admission medications   Medication Sig Start Date End Date Taking? Authorizing Provider  calcium carbonate (TUMS - DOSED IN MG ELEMENTAL CALCIUM) 500 MG chewable tablet Chew 2 tablets by mouth as needed for indigestion or heartburn.    [provider]  fexofenadine (ALLEGRA) 180 MG tablet Take 180 mg by mouth daily as needed for allergies.    [provider]  HYDROcodone-acetaminophen (NORCO/VICODIN) 5-325 MG tablet Take 1-2 tablets by mouth every 6 (six) hours as needed for moderate pain. 04/19/18   Christophe Louis, MD  HYDROCORTISONE EX Apply 1 application topically daily as needed (for itching/rash).    [provider]  ibuprofen (ADVIL,MOTRIN) 800 MG tablet Take 1 tablet (800 mg total) by mouth every 8 (eight) hours as needed. 04/19/18   Christophe Louis, MD  phenylephrine (SUDAFED PE) 10 MG TABS tablet Take 10 mg by mouth every 4 (four) hours as needed (for sinus/allergies.).    [provider]    Allergies    Patient has no known allergies.  Review of Systems   Review of Systems  Eyes:  Positive for visual disturbance.  Musculoskeletal:  Positive for neck pain.  Neurological:  Positive for numbness. Negative for weakness and headaches.  All other systems reviewed and are negative.  Physical Exam Updated Vital Signs BP 127/82   Pulse (!) 108   Temp 99.1 F (37.3 C) (Oral)   Resp 18   Ht '5\' 3"'$  (1.6 m)   Wt 70.3 kg   SpO2 100%   BMI 27.46 kg/m   Physical Exam Vitals and nursing note reviewed.  Constitutional:      Appearance: She is well-developed.  HENT:     Head: Normocephalic and atraumatic.     Right Ear: External ear normal.     Left Ear: External ear normal.     Nose: Nose normal.  Eyes:     General:        Right eye: No discharge.        Left eye: No discharge.     Extraocular Movements: Extraocular movements intact.     Pupils: Pupils are equal, round, and reactive to light.     Comments: Normal visual field  testing  Neck:   Cardiovascular:     Rate and Rhythm: Normal rate and regular rhythm.     Pulses:          Radial pulses are 2+ on the right side and 2+ on the left side.     Heart sounds: Normal heart sounds.  Pulmonary:     Effort: Pulmonary effort is normal.     Breath sounds: Normal breath sounds.  Abdominal:     Palpations: Abdomen is soft.     Tenderness: There is no abdominal tenderness.  Musculoskeletal:     Cervical back: Normal range of motion and neck supple. Muscular tenderness present. No spinous process tenderness.  Skin:    General: Skin is warm and dry.  Neurological:     Mental Status: She is alert.     Comments: CN 3-12 grossly intact. 5/5 strength in all 4 extremities. Grossly normal sensation.   Psychiatric:        Mood and Affect: Mood is not anxious.    ED Results / Procedures / Treatments   Labs (all labs ordered are listed, but only abnormal results are displayed) Labs Reviewed  CBC WITH DIFFERENTIAL/PLATELET - Abnormal; Notable for the following components:      Result Value   WBC 11.1 (*)    Lymphs Abs 4.4 (*)    All other components within normal limits  BASIC METABOLIC PANEL  MAGNESIUM  POC URINE PREG, ED    EKG None  Radiology DG Shoulder Right  Result Date: 05/22/2021 CLINICAL DATA:  Right shoulder pain. Status post pop sensation in right arm. EXAM: RIGHT SHOULDER - 2+ VIEW COMPARISON:  None. FINDINGS: There is no evidence of fracture or dislocation. There is no evidence of arthropathy or other focal bone abnormality. Soft tissues are unremarkable. IMPRESSION: Negative. Electronically Signed   By: Iven Finn M.D.   On: 05/22/2021 23:43    Procedures Procedures   Medications Ordered in ED Medications  sodium chloride 0.9 % bolus 1,000 mL (1,000 mLs Intravenous New Bag/Given 05/23/21 [id number])    ED Course  I have reviewed the triage vital signs and the nursing notes.  Pertinent labs & imaging results that were available during my  care of the patient were reviewed by me and considered in my medical decision making (see chart for details).    MDM Rules/Calculators/A&P                           Patient  has no reproducible neuro findings on exam currently.  However with her description as well as some perhaps mild neck trauma I think getting CTA to rule out neck artery dissection is reasonable.  May need to talk to neurology depending on results given the intermittent tingling/numbness.  Care transferred to Dr. Pearline Cables Final Clinical Impression(s) / ED Diagnoses Final diagnoses:  None    Rx / DC Orders ED Discharge Orders     None        Sherwood Gambler, MD 05/23/21 [phone]

## 2021-05-23 NOTE — ED Provider Notes (Signed)
    7:05 AM Patient signed out to me by previous ED physician.  Pt is a 45 yo female presenting for right sided neck pain/facial numbness and right arm numbness after getting hair done and twisting her neck at awkward angle.   Plan:  CTA Neuro consult   8:49 AM CTA demonstrates no acute process. I spoke with neurology team who recommends MR C-spine on outpatient bases for worsening symptoms.   Patient in no distress and overall condition improved here in the ED. Detailed discussions were had with the patient regarding current findings, and need for close f/u with PCP or on call doctor. The patient has been instructed to return immediately if the symptoms worsen in any way for re-evaluation. Patient verbalized understanding and is in agreement with current care plan. All questions answered prior to discharge.     Campbell Stall P, DO [id number] [phone]

## 2021-05-23 NOTE — ED Notes (Signed)
Patient transported to CT 

## 2021-05-23 NOTE — ED Notes (Signed)
Pt very anxious in room.  She is alert and oreinted.  Vitals are stable.  Pt reports pain down her right neck, shoulder and arm.  Pt reports her face "Sometimes goes numb when she is resting on it."No numbness at this time. Negative NIH at this time.

## 2021-07-08 ENCOUNTER — Other Ambulatory Visit: Payer: Self-pay | Admitting: Urgent Care

## 2021-07-08 DIAGNOSIS — Z1231 Encounter for screening mammogram for malignant neoplasm of breast: Secondary | ICD-10-CM

## 2021-08-03 ENCOUNTER — Other Ambulatory Visit: Payer: Self-pay

## 2021-08-03 ENCOUNTER — Ambulatory Visit
Admission: RE | Admit: 2021-08-03 | Discharge: 2021-08-03 | Disposition: A | Discharge status: Home / Self Care | Payer: BC Managed Care – PPO | Source: Ambulatory Visit

## 2021-08-03 DIAGNOSIS — Z1231 Encounter for screening mammogram for malignant neoplasm of breast: Secondary | ICD-10-CM

## 2022-02-19 ENCOUNTER — Emergency Department (HOSPITAL_COMMUNITY)
Admission: EM | Admit: 2022-02-19 | Discharge: 2022-02-19 | Disposition: A | Discharge status: Home / Self Care | Payer: BC Managed Care – PPO | Attending: Emergency Medicine | Admitting: Emergency Medicine

## 2022-02-19 ENCOUNTER — Other Ambulatory Visit: Payer: Self-pay

## 2022-02-19 DIAGNOSIS — X58XXXA Exposure to other specified factors, initial encounter: Secondary | ICD-10-CM | POA: Diagnosis not present

## 2022-02-19 DIAGNOSIS — S0501XA Injury of conjunctiva and corneal abrasion without foreign body, right eye, initial encounter: Secondary | ICD-10-CM | POA: Diagnosis present

## 2022-02-19 MED ORDER — GATIFLOXACIN 0.5 % OP SOLN
1.0000 [drp] | Freq: Once | OPHTHALMIC | Status: AC
  Administered 2022-02-19: 1 [drp] via OPHTHALMIC
  Filled 2022-02-19: qty 2.5

## 2022-02-19 MED ORDER — FLUORESCEIN SODIUM 1 MG OP STRP
1.0000 | ORAL_STRIP | Freq: Once | OPHTHALMIC | Status: AC
  Administered 2022-02-19: 1 via OPHTHALMIC
  Filled 2022-02-19: qty 1

## 2022-02-19 MED ORDER — TETRACAINE HCL 0.5 % OP SOLN
2.0000 [drp] | Freq: Once | OPHTHALMIC | Status: AC
  Administered 2022-02-19: 2 [drp] via OPHTHALMIC
  Filled 2022-02-19: qty 4

## 2022-02-19 NOTE — ED Provider Notes (Signed)
[facility] EMERGENCY DEPARTMENT Provider Note   CSN: [id number] Arrival date & time: 02/19/22  0602     History  Chief Complaint  Patient presents with   Eye Pain    Yolanda Patel is a 46 y.o. female.  Yolanda Patel is a 46 y.o. female with hx of seasonal allergies, and GERD, who presents to the emergency department for evaluation of eye pain.  Patient reports yesterday afternoon she noticed that her right eye was bothering her and her vision was a bit cloudy.  She thought it was just an issue with her contact.  When she got home at the end of the day she took out her contact but continued to have redness, irritation and pain in the eye and reports that her vision felt cloudy.  The eye is sensitive to light.  She denies any pain with eye movements.  Reports that she has been putting cool compress over the eye but that her eye seems puffy this morning and she continues to report sensation of foreign body in her eye.  She checked multiple times to ensure that she got the contact out of her eye.  No similar symptoms in the left eye.  Reports frequent tearing but no purulent drainage from the eye.  No fevers or chills.  No associated headache.  No other aggravating or alleviating factors.  The history is provided by the patient.      Home Medications Prior to Admission medications   Medication Sig Start Date End Date Taking? Authorizing Provider  calcium carbonate (TUMS - DOSED IN MG ELEMENTAL CALCIUM) 500 MG chewable tablet Chew 2 tablets by mouth as needed for indigestion or heartburn.    [provider]  fexofenadine (ALLEGRA) 180 MG tablet Take 180 mg by mouth daily as needed for allergies.    [provider]  HYDROcodone-acetaminophen (NORCO/VICODIN) 5-325 MG tablet Take 1-2 tablets by mouth every 6 (six) hours as needed for moderate pain. 04/19/18   Christophe Louis, MD  HYDROCORTISONE EX Apply 1 application topically daily as needed (for itching/rash).     [provider]  ibuprofen (ADVIL,MOTRIN) 800 MG tablet Take 1 tablet (800 mg total) by mouth every 8 (eight) hours as needed. 04/19/18   Christophe Louis, MD  phenylephrine (SUDAFED PE) 10 MG TABS tablet Take 10 mg by mouth every 4 (four) hours as needed (for sinus/allergies.).    [provider]      Allergies    Patient has no known allergies.    Review of Systems   Review of Systems  Constitutional:  Negative for chills and fever.  HENT: Negative.    Eyes:  Positive for photophobia, pain and redness.  Neurological:  Negative for headaches.   Physical Exam Updated Vital Signs BP (!) 139/94 (BP Location: Right Arm)   Pulse (!) 119   Temp 98.7 F (37.1 C) (Oral)   Resp 18   SpO2 99%  Physical Exam Vitals and nursing note reviewed.  Constitutional:      General: She is not in acute distress.    Appearance: Normal appearance. She is well-developed. She is not ill-appearing or diaphoretic.  HENT:     Head: Normocephalic and atraumatic.  Eyes:     Comments: Very mild swelling surrounding the right eye, sclera is injected, tearing presents, but no purulence, EOMI, PERRLA, no proptosis. Eye pressure 21 mmHg.  On fluorescein staining there is an abrasion across the center of the cornea measuring about a half a  centimeter.  Negative Seidel sign.  No visible foreign body.  The left eye is unremarkable.  Pulmonary:     Effort: Pulmonary effort is normal. No respiratory distress.  Neurological:     Mental Status: She is alert and oriented to person, place, and time.     Coordination: Coordination normal.  Psychiatric:        Mood and Affect: Mood normal.        Behavior: Behavior normal.    ED Results / Procedures / Treatments   Labs (all labs ordered are listed, but only abnormal results are displayed) Labs Reviewed - No data to display  EKG None  Radiology No results found.  Procedures Procedures    Medications Ordered in ED Medications  fluorescein  ophthalmic strip 1 strip (1 strip Right Eye Given by Other 02/19/22 0742)  tetracaine (PONTOCAINE) 0.5 % ophthalmic solution 2 drop (2 drops Right Eye Given by Other 02/19/22 0742)  gatifloxacin (ZYMAXID) 0.5 % ophthalmic drops 1 drop (1 drop Right Eye Given 02/19/22 0800)    ED Course/ Medical Decision Making/ A&P                           Medical Decision Making  46 y.o. female presents to the ED with complaints of right eye pain, this involves an extensive number of treatment options, and is a complaint that carries with it a high risk of complications and morbidity.  The differential diagnosis includes corneal abrasion, corneal ulcer, foreign body, conjunctivitis, acute angle-closure glaucoma, iritis  On arrival pt is nontoxic, vitals significant for tachycardia on arrival likely due to pain, after treatment of pain tachycardia has resolved. Exam significant for fluorescein staining consistent with corneal abrasion of the right eye.  Normal intraocular pressure.  No concern for acute angle-closure glaucoma.  Additional history obtained from husband at bedside. Previous records obtained and reviewed   I ordered medication, including flouroquinolone eye drops for contact lens associated corneal abrasion  ED Course:   Discussed diagnosis of corneal abrasion with patient, patient is traveling today so patient was provided antibiotic drops here in the emergency department and given instructions on dosing.  Patient instructed to avoid wearing contacts until she is followed up with her eye doctor or the provided ophthalmologist on-call to ensure that abrasion has healed.  Discussed return precautions and outpatient follow-up.  At this time there does not appear to be any evidence of an acute emergency medical condition requiring further emergent evaluation and the patient appears stable for discharge with appropriate outpatient follow up. Diagnosis and return precautions discussed with patient who  verbalizes understanding and is agreeable to discharge.    Portions of this note were generated with [employer]. Dictation errors may occur despite best attempts at proofreading.         Final Clinical Impression(s) / ED Diagnoses Final diagnoses:  Abrasion of right cornea, initial encounter    Rx / DC Orders ED Discharge Orders     None         Jacqlyn Larsen, PA-C 02/19/22 0800    Deno Etienne, DO 02/19/22 [phone]

## 2022-02-19 NOTE — Discharge Instructions (Addendum)
Please use eyedrops 1 to 2 drops every 2 hours for the first 2 days and then 4 times a day for the next 5 days.  DO NOT wear contacts until you have followed up with your eye doctor or the ophthalmologist on you paperwork today to ensure the abrasion has healed.  You can use artifical tears eye drops to help soothe irritation, you can refrigerate these. Do not use immediately after antibiotic drops.  If you are having worsening eye pain, vision changes, or swelling around your eye you should return for re-evaluation.

## 2022-02-19 NOTE — ED Notes (Signed)
Tonopen, woods lamp, strips and drops at bedside.

## 2022-02-19 NOTE — ED Triage Notes (Addendum)
Pt arrived via POV for cc of eye pain, blurry vision in right eye. Pt states noted cloudy vision Thursday during the day, pt states she took out her contact lens last night and has been experiencing increasing cloudiness, increasing pain, and tearing. Pt states she feels like something is in her eye. Pain 8/10.Marland Kitchen Swelling is noted around the area. Pt holding cool rag to eye at this time.

## 2022-04-15 ENCOUNTER — Other Ambulatory Visit: Payer: Self-pay | Admitting: Gastroenterology

## 2022-04-15 DIAGNOSIS — R1012 Left upper quadrant pain: Secondary | ICD-10-CM

## 2022-04-22 ENCOUNTER — Ambulatory Visit
Admission: RE | Admit: 2022-04-22 | Discharge: 2022-04-22 | Disposition: A | Discharge status: Home / Self Care | Payer: BC Managed Care – PPO | Source: Ambulatory Visit | Attending: Gastroenterology | Admitting: Gastroenterology

## 2022-04-22 DIAGNOSIS — R1012 Left upper quadrant pain: Secondary | ICD-10-CM

## 2022-06-24 ENCOUNTER — Other Ambulatory Visit (HOSPITAL_COMMUNITY)
Admission: RE | Admit: 2022-06-24 | Discharge: 2022-06-24 | Disposition: A | Discharge status: Home / Self Care | Payer: BC Managed Care – PPO | Source: Ambulatory Visit | Attending: Obstetrics and Gynecology | Admitting: Obstetrics and Gynecology

## 2022-06-24 DIAGNOSIS — Z01419 Encounter for gynecological examination (general) (routine) without abnormal findings: Secondary | ICD-10-CM | POA: Insufficient documentation

## 2022-06-29 LAB — CYTOLOGY - PAP
Comment: NEGATIVE
Diagnosis: NEGATIVE
High risk HPV: NEGATIVE

## 2022-07-12 ENCOUNTER — Other Ambulatory Visit: Payer: Self-pay | Admitting: Family Medicine

## 2022-07-12 DIAGNOSIS — Z1231 Encounter for screening mammogram for malignant neoplasm of breast: Secondary | ICD-10-CM

## 2022-07-19 IMAGING — CT CT ANGIO HEAD-NECK (W OR W/O PERF)
1 of 11 series · 5 of 33 positions shown · IV contrast (OMNI 350)
Comparison: Head CT [DATE].

CLINICAL DATA: 44-year-old female "felt a pop" in her neck on
[REDACTED]. Subsequent intermittent left face and arm numbness.

EXAM:
CT ANGIOGRAPHY HEAD AND NECK
TECHNIQUE: Multidetector CT imaging of the head and neck was performed using
the standard protocol during bolus administration of intravenous
contrast. Multiplanar CT image reconstructions and MIPs were
obtained to evaluate the vascular anatomy. Carotid stenosis
measurements (when applicable) are obtained utilizing NASCET
criteria, using the distal internal carotid diameter as the
denominator.
CONTRAST:  75mL OMNIPAQUE IOHEXOL 350 MG/ML SOLN

[Series 19: cta neck axial · axial · 0.39mm/px · z∈[+934,+1134]mm · 5 of 303 slices shown]
[im 51/303  soft-tissue]
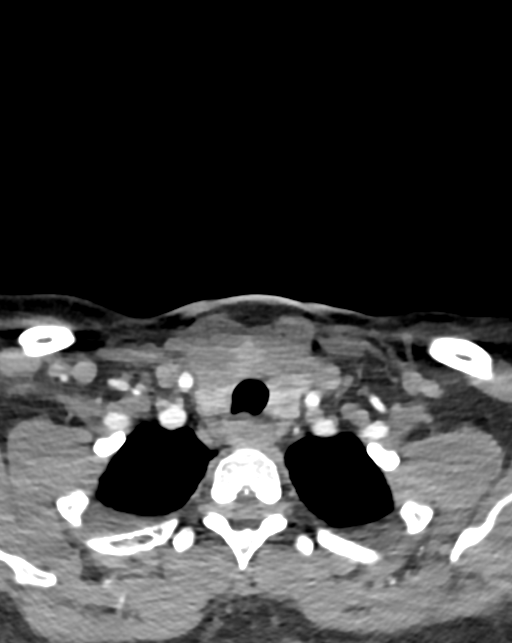
[im 101/303  bone]
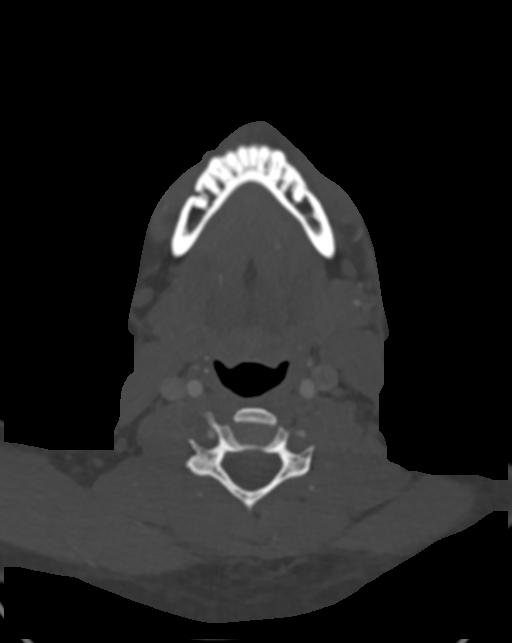
[im 152/303  soft-tissue]
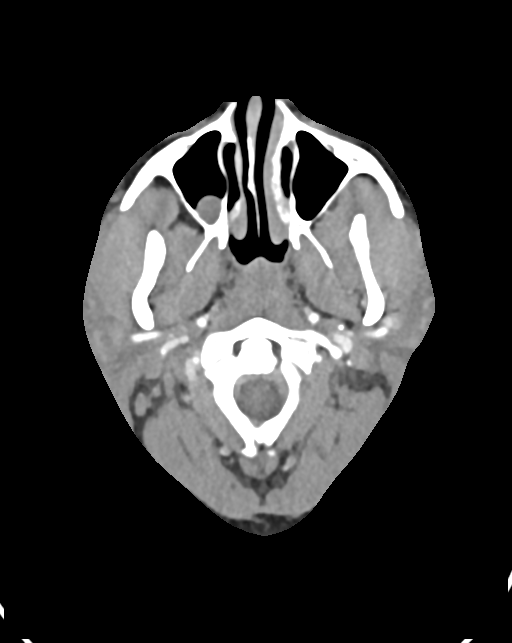
[im 202/303  bone]
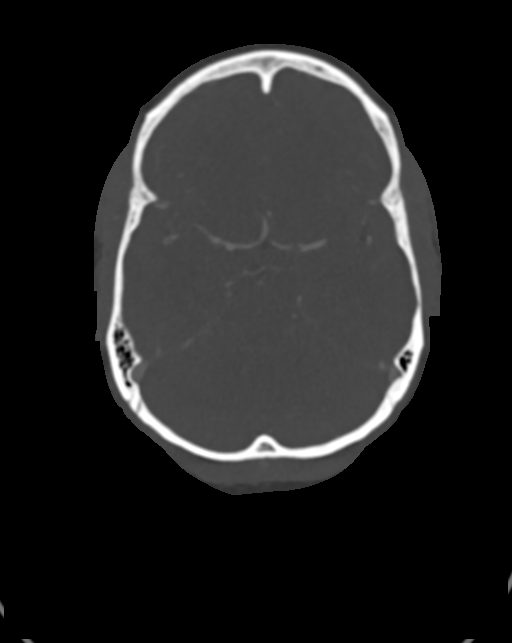
[im 252/303  soft-tissue]
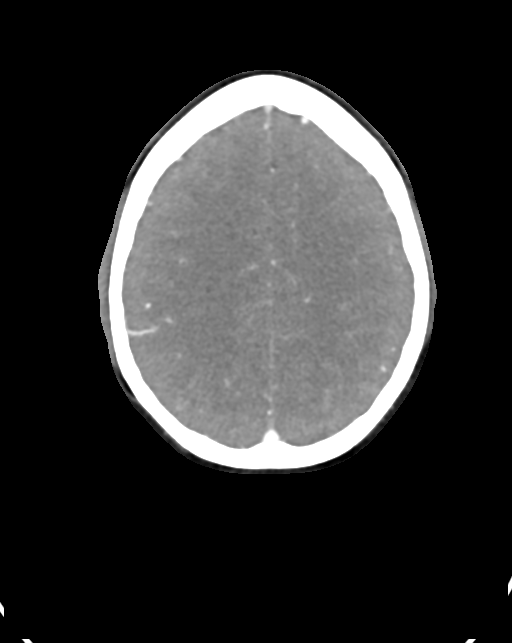

[5 of 33 positions shown; findings below may reference images not displayed]

FINDINGS: CT HEAD

Brain: Cerebral volume remains within normal limits. No midline
shift, ventriculomegaly, mass effect, evidence of mass lesion,
intracranial hemorrhage or evidence of cortically based acute
infarction. Gray-white matter differentiation is within normal
limits throughout the brain.

Calvarium and skull base: Stable and negative.

Paranasal sinuses: Small mucous retention cyst now in the left
sphenoid sinus. Other visualized paranasal sinuses and mastoids are
clear. Tympanic cavities are clear.

Orbits: Visualized scalp soft tissues are within normal limits.
Visualized orbit soft tissues are within normal limits.

CTA NECK

Skeleton: No osseous abnormality identified.

Upper chest: Negative.

Other neck: Negative.

Aortic arch: Three-vessel arch configuration. No arch
atherosclerosis.

Right carotid system: Negative.

Left carotid system: Negative.

Vertebral arteries:
Normal proximal right subclavian artery. Normal right vertebral
artery origin. The right vertebral is mildly tortuous in the neck,
patent to the skull base without plaque or stenosis.

Normal proximal left subclavian artery. Non dominant left vertebral
artery. Normal left vertebral origin. Tortuous left V2 segment. The
left vertebral remains non dominant but patent to the skull base
without plaque or stenosis.

CTA HEAD

Posterior circulation: The distal left vertebral artery functionally
terminates in PICA, with small contribution to the vertebrobasilar
junction. Normal right V4 segment. No distal vertebral stenosis.
Patent but diminutive basilar artery which functionally terminates
in the SCA is. Fetal type bilateral PCA origins. There are small P1
segments. Bilateral PCA branches are within normal limits.

Anterior circulation: Both ICA siphons are patent. Normal bilateral
posterior communicating artery origins. Patent and normal carotid
termini. Normal MCA and ACA origins. Anterior communicating artery
and bilateral ACA branches are within normal limits, the right ACA
appears mildly dominant. Left MCA M1 segment and trifurcation are
patent without stenosis. Right MCA M1 segment and trifurcation are
patent without stenosis. Bilateral MCA branches are within normal
limits.

Venous sinuses: Patent.

Anatomic variants: Dominant right vertebral artery, the non dominant
left vertebral functionally terminates in PICA. Diminutive basilar
artery in the setting of fetal type bilateral PCA origins.

Review of the MIP images confirms the above findings
IMPRESSION: 1. Normal CTA Head and Neck.
2.  Normal Head CT.

## 2022-08-14 ENCOUNTER — Ambulatory Visit
Admission: EM | Admit: 2022-08-14 | Discharge: 2022-08-14 | Disposition: A | Discharge status: Home / Self Care | Payer: BC Managed Care – PPO | Attending: Emergency Medicine | Admitting: Emergency Medicine

## 2022-08-14 VITALS — BP 129/84 | HR 114 | Temp 99.0°F | Resp 18

## 2022-08-14 DIAGNOSIS — R21 Rash and other nonspecific skin eruption: Secondary | ICD-10-CM

## 2022-08-14 MED ORDER — TRIAMCINOLONE ACETONIDE 0.1 % EX CREA: 1.0000 | TOPICAL_CREAM | Freq: Two times a day (BID) | CUTANEOUS | 0 refills | Status: DC

## 2022-08-14 NOTE — ED Triage Notes (Signed)
Pt c/o rash to neck that she gets often. The patient states she usually uses ketoconazole for the rash.  Stared: 2 weeks ago

## 2022-08-14 NOTE — ED Provider Notes (Signed)
[facility]    CSN: [id number] Arrival date & time: 08/14/22  1453    HISTORY   Chief Complaint  Patient presents with   Rash   HPI Yolanda Patel is a pleasant, 46 y.o. female who presents to urgent care today. Patient complains of a recurrent rash at the base of her neck.  Patient states she is usually prescribed a cream for the rash which always resolves it, states she is unaware the name of the cream, thinks it might be ketoconazole.  Patient denies rash elsewhere.  Patient states current rash has been present for less than 24 hours.  The history is provided by the patient.   Past Medical History:  Diagnosis Date   Anxiety    panic attacks - no med   GERD (gastroesophageal reflux disease)    otc med - tums prn   Seasonal allergies    SVD (spontaneous vaginal delivery)    x 2 - both in [place]   Patient Active Problem List   Diagnosis Date Noted   Ovarian mass, left 04/19/2018   Past Surgical History:  Procedure Laterality Date   LAPAROSCOPIC SALPINGO OOPHERECTOMY Left 04/19/2018   Procedure: LAPAROSCOPIC LEFT SALPINGO OOPHORECTOMY;  Surgeon: Christophe Louis, MD;  Location: [facility];  Service: Gynecology;  Laterality: Left;   NO PAST SURGERIES     OB History   No obstetric history on file.    Home Medications    Prior to Admission medications   Medication Sig Start Date End Date Taking? Authorizing Provider  triamcinolone cream (KENALOG) 0.1 % Apply 1 Application topically 2 (two) times daily. Apply to affected area(s) twice daily , do not apply to face.  Discontinue use after 5 days. 08/14/22  Yes Lynden Oxford Scales, PA-C  calcium carbonate (TUMS - DOSED IN MG ELEMENTAL CALCIUM) 500 MG chewable tablet Chew 2 tablets by mouth as needed for indigestion or heartburn.    [provider]  fexofenadine (ALLEGRA) 180 MG tablet Take 180 mg by mouth daily as needed for allergies.    [provider]  ibuprofen (ADVIL,MOTRIN) 800 MG tablet Take 1 tablet  (800 mg total) by mouth every 8 (eight) hours as needed. 04/19/18   Christophe Louis, MD    Family History History reviewed. No pertinent family history. Social History Social History   Tobacco Use   Smoking status: Never   Smokeless tobacco: Never  Vaping Use   Vaping Use: Never used  Substance Use Topics   Alcohol use: Yes    Comment: occasional   Drug use: Never   Allergies   Patient has no known allergies.  Review of Systems Review of Systems Pertinent findings revealed after performing a 14 point review of systems has been noted in the history of present illness.  Physical Exam Triage Vital Signs ED Triage Vitals  Enc Vitals Group     BP 07/24/21 0827 (!) 147/82     Pulse Rate 07/24/21 0827 72     Resp 07/24/21 0827 18     Temp 07/24/21 0827 98.3 F (36.8 C)     Temp Source 07/24/21 0827 Oral     SpO2 07/24/21 0827 98 %     Weight --      Height --      Head Circumference --      Peak Flow --      Pain Score 07/24/21 0826 5     Pain Loc --      Pain Edu? --  Excl. in GC? --   No data found.  Updated Vital Signs BP 129/84 (BP Location: Right Arm)   Pulse (!) 114   Temp 99 F (37.2 C) (Oral)   Resp 18   LMP 07/24/2022   SpO2 96%   Physical Exam Vitals and nursing note reviewed.  Constitutional:      General: She is not in acute distress.    Appearance: Normal appearance.  HENT:     Head: Normocephalic and atraumatic.  Eyes:     Pupils: Pupils are equal, round, and reactive to light.  Cardiovascular:     Rate and Rhythm: Normal rate and regular rhythm.  Pulmonary:     Effort: Pulmonary effort is normal.     Breath sounds: Normal breath sounds.  Musculoskeletal:        General: Normal range of motion.     Cervical back: Normal range of motion and neck supple.  Skin:    General: Skin is warm and dry.     Findings: Rash (Macular papular lesions without excoriation or drainage appreciated around based the neck in linear pattern encircling) present.   Neurological:     General: No focal deficit present.     Mental Status: She is alert and oriented to person, place, and time. Mental status is at baseline.  Psychiatric:        Mood and Affect: Mood normal.        Behavior: Behavior normal.        Thought Content: Thought content normal.        Judgment: Judgment normal.     Visual Acuity Right Eye Distance:   Left Eye Distance:   Bilateral Distance:    Right Eye Near:   Left Eye Near:    Bilateral Near:     UC Couse / Diagnostics / Procedures:     Radiology No results found.  Procedures Procedures (including critical care time) EKG  Pending results:  Labs Reviewed - No data to display  Medications Ordered in UC: Medications - No data to display  UC Diagnoses / Final Clinical Impressions(s)   I have reviewed the triage vital signs and the nursing notes.  Pertinent labs & imaging results that were available during my care of the patient were reviewed by me and considered in my medical decision making (see chart for details).    Final diagnoses:  Rash and nonspecific skin eruption   Patient advised that I do not believe that her rash is fungal in appearance and more likely urticarial.  Patient provided with triamcinolone cream to apply twice daily until rash resolves.  Patient advised to follow-up with primary care for further evaluation.  ED Prescriptions     Medication Sig Dispense Auth. Provider   triamcinolone cream (KENALOG) 0.1 % Apply 1 Application topically 2 (two) times daily. Apply to affected area(s) twice daily , do not apply to face.  Discontinue use after 5 days. 30 g Lynden Oxford Scales, PA-C      PDMP not reviewed this encounter.  Pending results:  Labs Reviewed - No data to display  Discharge Instructions:   Discharge Instructions      Please apply triamcinolone cream to affected area twice daily for 5 days.  If you do not have meaningful improvement after 5 days of treatment, please  return for reevaluation or follow-up with your primary care provider.    Disposition Upon Discharge:  Condition: stable for discharge home  Patient presented with an acute illness with  associated systemic symptoms and significant discomfort requiring urgent management. In my opinion, this is a condition that a prudent lay person (someone who possesses an average knowledge of health and medicine) may potentially expect to result in complications if not addressed urgently such as respiratory distress, impairment of bodily function or dysfunction of bodily organs.   Routine symptom specific, illness specific and/or disease specific instructions were discussed with the patient and/or caregiver at length.   As such, the patient has been evaluated and assessed, work-up was performed and treatment was provided in alignment with urgent care protocols and evidence based medicine.  Patient/parent/caregiver has been advised that the patient may require follow up for further testing and treatment if the symptoms continue in spite of treatment, as clinically indicated and appropriate.  Patient/parent/caregiver has been advised to return to the [facility] or PCP if no better; to PCP or the Emergency Department if new signs and symptoms develop, or if the current signs or symptoms continue to change or worsen for further workup, evaluation and treatment as clinically indicated and appropriate  The patient will follow up with their current PCP if and as advised. If the patient does not currently have a PCP we will assist them in obtaining one.   The patient may need specialty follow up if the symptoms continue, in spite of conservative treatment and management, for further workup, evaluation, consultation and treatment as clinically indicated and appropriate.   Patient/parent/caregiver verbalized understanding and agreement of plan as discussed.  All questions were addressed during visit.  Please see discharge  instructions below for further details of plan.  This office note has been dictated using Museum/gallery curator.  Unfortunately, this method of dictation can sometimes lead to typographical or grammatical errors.  I apologize for your inconvenience in advance if this occurs.  Please do not hesitate to reach out to me if clarification is needed.      Lynden Oxford Scales, PA-C 08/16/22 1409

## 2022-08-14 NOTE — Discharge Instructions (Signed)
Please apply triamcinolone cream to affected area twice daily for 5 days.  If you do not have meaningful improvement after 5 days of treatment, please return for reevaluation or follow-up with your primary care provider.

## 2022-08-23 ENCOUNTER — Ambulatory Visit
Admission: RE | Admit: 2022-08-23 | Discharge: 2022-08-23 | Disposition: A | Discharge status: Home / Self Care | Payer: BC Managed Care – PPO | Source: Ambulatory Visit

## 2022-08-23 DIAGNOSIS — Z1231 Encounter for screening mammogram for malignant neoplasm of breast: Secondary | ICD-10-CM

## 2022-10-29 ENCOUNTER — Encounter: Payer: Self-pay | Admitting: Urgent Care

## 2022-10-29 ENCOUNTER — Ambulatory Visit: Payer: BC Managed Care – PPO | Admitting: Family Medicine

## 2022-10-29 ENCOUNTER — Ambulatory Visit: Payer: BC Managed Care – PPO | Attending: Family Medicine | Admitting: Family Medicine

## 2022-10-29 ENCOUNTER — Encounter: Payer: Self-pay | Admitting: Family Medicine

## 2022-10-29 VITALS — BP 120/79 | HR 99 | Temp 98.4°F | Wt 166.6 lb

## 2022-10-29 DIAGNOSIS — Z0000 Encounter for general adult medical examination without abnormal findings: Secondary | ICD-10-CM

## 2022-10-29 DIAGNOSIS — J302 Other seasonal allergic rhinitis: Secondary | ICD-10-CM

## 2022-10-29 DIAGNOSIS — Z1159 Encounter for screening for other viral diseases: Secondary | ICD-10-CM | POA: Diagnosis not present

## 2022-10-29 DIAGNOSIS — Z13220 Encounter for screening for lipoid disorders: Secondary | ICD-10-CM | POA: Diagnosis not present

## 2022-10-29 DIAGNOSIS — F419 Anxiety disorder, unspecified: Secondary | ICD-10-CM

## 2022-10-29 DIAGNOSIS — Z131 Encounter for screening for diabetes mellitus: Secondary | ICD-10-CM | POA: Diagnosis not present

## 2022-10-29 DIAGNOSIS — Z136 Encounter for screening for cardiovascular disorders: Secondary | ICD-10-CM

## 2022-10-29 DIAGNOSIS — K219 Gastro-esophageal reflux disease without esophagitis: Secondary | ICD-10-CM

## 2022-10-29 NOTE — Patient Instructions (Signed)

## 2022-10-29 NOTE — Progress Notes (Signed)
Subjective:  Patient ID: Yolanda Patel, female    DOB: [birth year]  Age: 47 y.o. MRN: [id number]  CC: Establish Care   HPI Aviv Rota is a 47 y.o. year old female with a history of GERD, anxiety, seasonal allergies who presents to establish care. Previously followed by [facility].  Interval History:  She was initially scheduled to see Dr. Redmond Pulling at [facility] but visit was changed to our clinic due to an emergency at the clinic. Last colonoscopy was with [facility] in 2023. She is up-to-date on her mammogram and Pap smear. She has an adequate intake of fruits and veggies. Does not exercise much.  Sees the Dentist and Ophthalmologist regularly. Her anxiety and reflux symptoms are controlled and she currently does not take medications for this.  She uses an OTC antihistamine for seasonal allergies. Denies presence of additional concerns.  Past Medical History:  Diagnosis Date   Anxiety    panic attacks - no med   GERD (gastroesophageal reflux disease)    otc med - tums prn   Seasonal allergies    SVD (spontaneous vaginal delivery)    x 2 - both in [place]    Past Surgical History:  Procedure Laterality Date   LAPAROSCOPIC SALPINGO OOPHERECTOMY Left 04/19/2018   Procedure: LAPAROSCOPIC LEFT SALPINGO OOPHORECTOMY;  Surgeon: Christophe Louis, MD;  Location: [facility];  Service: Gynecology;  Laterality: Left;   NO PAST SURGERIES      History reviewed. No pertinent family history.  Social History   Socioeconomic History   Marital status: Married    Spouse name: Not on file   Number of children: Not on file   Years of education: Not on file   Highest education level: Not on file  Occupational History   Not on file  Tobacco Use   Smoking status: Never   Smokeless tobacco: Never  Vaping Use   Vaping Use: Never used  Substance and Sexual Activity   Alcohol use: Yes    Comment: occasional   Drug use: Never   Sexual activity: Yes    Birth control/protection: Pill   Other Topics Concern   Not on file  Social History Narrative   Not on file   Social Determinants of Health   Financial Resource Strain: Not on file  Food Insecurity: Not on file  Transportation Needs: Not on file  Physical Activity: Not on file  Stress: Not on file  Social Connections: Not on file    No Known Allergies  Outpatient Medications Prior to Visit  Medication Sig Dispense Refill   triamcinolone cream (KENALOG) 0.1 % Apply 1 Application topically 2 (two) times daily. Apply to affected area(s) twice daily , do not apply to face.  Discontinue use after 5 days. 30 g 0   calcium carbonate (TUMS - DOSED IN MG ELEMENTAL CALCIUM) 500 MG chewable tablet Chew 2 tablets by mouth as needed for indigestion or heartburn.     fexofenadine (ALLEGRA) 180 MG tablet Take 180 mg by mouth daily as needed for allergies.     ibuprofen (ADVIL,MOTRIN) 800 MG tablet Take 1 tablet (800 mg total) by mouth every 8 (eight) hours as needed. 30 tablet 0   No facility-administered medications prior to visit.     ROS Review of Systems  Constitutional:  Negative for activity change and appetite change.  HENT:  Negative for sinus pressure and sore throat.   Respiratory:  Negative for chest tightness, shortness of breath and wheezing.  Cardiovascular:  Negative for chest pain and palpitations.  Gastrointestinal:  Negative for abdominal distention, abdominal pain and constipation.  Genitourinary: Negative.   Musculoskeletal: Negative.   Psychiatric/Behavioral:  Negative for behavioral problems and dysphoric mood.     Objective:  BP 120/79   Pulse 99   Temp 98.4 F (36.9 C)   Wt 166 lb 9.6 oz (75.6 kg)   SpO2 100%   BMI 29.51 kg/m      10/29/2022    9:01 AM 08/14/2022    3:13 PM 02/19/2022    8:00 AM  BP/Weight  Systolic BP 250 539 767  Diastolic BP 79 84 84  Wt. (Lbs) 166.6    BMI 29.51 kg/m2        Physical Exam Constitutional:      Appearance: She is well-developed.   Cardiovascular:     Rate and Rhythm: Normal rate.     Heart sounds: Normal heart sounds. No murmur heard. Pulmonary:     Effort: Pulmonary effort is normal.     Breath sounds: Normal breath sounds. No wheezing or rales.  Chest:     Chest wall: No tenderness.  Abdominal:     General: Bowel sounds are normal. There is no distension.     Palpations: Abdomen is soft. There is no mass.     Tenderness: There is no abdominal tenderness.  Musculoskeletal:        General: Normal range of motion.  Neurological:     Mental Status: She is alert and oriented to person, place, and time.        Latest Ref Rng & Units 05/23/2021    6:07 AM 05/31/2011    6:48 PM  CMP  Glucose 70 - 99 mg/dL 104  117   BUN 6 - 20 mg/dL 16  13   Creatinine 0.44 - 1.00 mg/dL 0.99  0.90   Sodium 135 - 145 mmol/L 137  142   Potassium 3.5 - 5.1 mmol/L 4.1  3.7   Chloride 98 - 111 mmol/L 107  105   CO2 22 - 32 mmol/L 22    Calcium 8.9 - 10.3 mg/dL 9.1      Lipid Panel  No results found for: "CHOL", "TRIG", "HDL", "CHOLHDL", "VLDL", "LDLCALC", "LDLDIRECT"  CBC    Component Value Date/Time   WBC 11.1 (H) 05/23/2021 0607   RBC 4.46 05/23/2021 0607   HGB 13.4 05/23/2021 0607   HCT 40.9 05/23/2021 0607   PLT 331 05/23/2021 0607   MCV 91.7 05/23/2021 0607   MCH 30.0 05/23/2021 0607   MCHC 32.8 05/23/2021 0607   RDW 14.0 05/23/2021 0607   LYMPHSABS 4.4 (H) 05/23/2021 0607   MONOABS 0.9 05/23/2021 0607   EOSABS 0.4 05/23/2021 0607   BASOSABS 0.1 05/23/2021 0607    No results found for: "HGBA1C"  Assessment & Plan:  1. Annual physical exam Counseled on 150 minutes of exercise per week, healthy eating (including decreased daily intake of saturated fats, cholesterol, added sugars, sodium), routine healthcare maintenance.  - CMP14+EGFR - CBC with Differential/Platelet  2. Screening for viral disease - HCV Ab w Reflex to Quant PCR - HIV Antibody (routine testing w rflx)  3. Encounter for lipid screening  for cardiovascular disease - LP+Non-HDL Cholesterol  4. Screening for diabetes mellitus - Hemoglobin A1c  5. Anxiety Stable Currently not on medications  6. Gastroesophageal reflux disease without esophagitis Stable Currently not on medications  7. Seasonal allergies Controlled She uses an OTC   Health Care Maintenance:  She is up-to-date on Pap smear, mammogram and colonoscopy. No orders of the defined types were placed in this encounter.   Follow-up: Return in about 1 year (around 10/30/2023) for Dr. Redmond Pulling, annual physical exam.       Charlott Rakes, MD, FAAFP. [facility] [place], [place]   10/29/2022, 9:44 AM

## 2022-10-30 LAB — CBC WITH DIFFERENTIAL/PLATELET
Basophils Absolute: 0.1 10*3/uL (ref 0.0–0.2)
Basos: 1 %
EOS (ABSOLUTE): 0.4 10*3/uL (ref 0.0–0.4)
Eos: 5 %
Hematocrit: 43.2 % (ref 34.0–46.6)
Hemoglobin: 13.6 g/dL (ref 11.1–15.9)
Immature Grans (Abs): 0 10*3/uL (ref 0.0–0.1)
Immature Granulocytes: 0 %
Lymphocytes Absolute: 2.5 10*3/uL (ref 0.7–3.1)
Lymphs: 36 %
MCH: 28.1 pg (ref 26.6–33.0)
MCHC: 31.5 g/dL (ref 31.5–35.7)
MCV: 89 fL (ref 79–97)
Monocytes Absolute: 0.4 10*3/uL (ref 0.1–0.9)
Monocytes: 6 %
Neutrophils Absolute: 3.6 10*3/uL (ref 1.4–7.0)
Neutrophils: 52 %
Platelets: 349 10*3/uL (ref 150–450)
RBC: 4.84 x10E6/uL (ref 3.77–5.28)
RDW: 13 % (ref 11.7–15.4)
WBC: 6.9 10*3/uL (ref 3.4–10.8)

## 2022-10-30 LAB — HCV AB W REFLEX TO QUANT PCR: HCV Ab: NONREACTIVE

## 2022-10-30 LAB — LP+NON-HDL CHOLESTEROL
Cholesterol, Total: 200 mg/dL — ABNORMAL HIGH (ref 100–199)
HDL: 43 mg/dL (ref >39)
LDL Chol Calc (NIH): 144 mg/dL — ABNORMAL HIGH (ref 0–99)
Total Non-HDL-Chol (LDL+VLDL): 157 mg/dL — ABNORMAL HIGH (ref 0–129)
Triglycerides: 72 mg/dL (ref 0–149)
VLDL Cholesterol Cal: 13 mg/dL (ref 5–40)

## 2022-10-30 LAB — CMP14+EGFR
ALT: 23 IU/L (ref 0–32)
AST: 23 IU/L (ref 0–40)
Albumin/Globulin Ratio: 1.3 (ref 1.2–2.2)
Albumin: 4 g/dL (ref 3.9–4.9)
Alkaline Phosphatase: 82 IU/L (ref 44–121)
BUN/Creatinine Ratio: 8 — ABNORMAL LOW (ref 9–23)
BUN: 9 mg/dL (ref 6–24)
Bilirubin Total: <0.2 mg/dL (ref 0.0–1.2)
CO2: 26 mmol/L (ref 20–29)
Calcium: 9.4 mg/dL (ref 8.7–10.2)
Chloride: 104 mmol/L (ref 96–106)
Creatinine, Ser: 1.08 mg/dL — ABNORMAL HIGH (ref 0.57–1.00)
Globulin, Total: 3 g/dL (ref 1.5–4.5)
Glucose: 107 mg/dL — ABNORMAL HIGH (ref 70–99)
Potassium: 4.7 mmol/L (ref 3.5–5.2)
Sodium: 141 mmol/L (ref 134–144)
Total Protein: 7 g/dL (ref 6.0–8.5)
eGFR: 64 mL/min/{1.73_m2} (ref >59)

## 2022-10-30 LAB — HCV INTERPRETATION

## 2022-10-30 LAB — HEMOGLOBIN A1C
Est. average glucose Bld gHb Est-mCnc: 123 mg/dL
Hgb A1c MFr Bld: 5.9 % — ABNORMAL HIGH (ref 4.8–5.6)

## 2022-10-30 LAB — HIV ANTIBODY (ROUTINE TESTING W REFLEX): HIV Screen 4th Generation wRfx: NONREACTIVE

## 2022-12-27 ENCOUNTER — Ambulatory Visit: Payer: Self-pay | Admitting: *Deleted

## 2022-12-27 ENCOUNTER — Telehealth: Payer: Self-pay | Admitting: Family Medicine

## 2022-12-27 NOTE — Telephone Encounter (Signed)
See NT encounter

## 2022-12-27 NOTE — Telephone Encounter (Signed)
Patient would like to discuss starting Phentermine. Please follow up with patient.

## 2022-12-27 NOTE — Telephone Encounter (Signed)
Reason for Disposition  Prescription request for new medicine (not a refill)  Answer Assessment - Initial Assessment Questions 1. NAME of MEDICINE: "What medicine(s) are you calling about?"     Phentermine- patient is requesting an appointment to discuss  2. QUESTION: "What is your question?" (e.g., double dose of medicine, side effect)     Weight loss Patient advised she would need appointment to discuss starting this medication- patient had her original appointment scheduled at [facility]- but due to the office emergency her appointment was covered by [facility] provider. Patient does want to see Dr Redmond Pulling- and she has scheduled an appointment- she wants to come in sooner to be seen consult regarding this medication. Patient advised that appointment would need to be approved by the office- message sent.  Protocols used: Medication Question Call-A-AH

## 2022-12-27 NOTE — Telephone Encounter (Signed)
  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 Patient has not been seen by provider but she is requesting a sooner appointment. Patient advised that would have to be approved by the provider.

## 2022-12-27 NOTE — Telephone Encounter (Signed)
Summary: Medication Questions   Patient would like to discuss starting Phentermine for weight loss. Patient did not want to schedule appointment. Patient wants provider to give rx based on recent labs.     Called patient [phone] to review medication request for weight loss. No answer, LVMTCB [phone].

## 2023-01-26 ENCOUNTER — Ambulatory Visit: Payer: BC Managed Care – PPO | Admitting: Family Medicine

## 2023-01-26 ENCOUNTER — Encounter: Payer: Self-pay | Admitting: Family Medicine

## 2023-01-26 VITALS — BP 132/84 | HR 91 | Temp 99.2°F | Resp 16 | Ht 63.0 in | Wt 168.0 lb

## 2023-01-26 DIAGNOSIS — N951 Menopausal and female climacteric states: Secondary | ICD-10-CM | POA: Diagnosis not present

## 2023-01-26 DIAGNOSIS — Z7689 Persons encountering health services in other specified circumstances: Secondary | ICD-10-CM

## 2023-01-26 DIAGNOSIS — E785 Hyperlipidemia, unspecified: Secondary | ICD-10-CM

## 2023-01-26 MED ORDER — PHENTERMINE HCL 37.5 MG PO TABS: ORAL_TABLET | ORAL | 0 refills | Status: DC

## 2023-01-26 NOTE — Progress Notes (Signed)
New Patient Office Visit  Subjective    Patient ID: Yolanda Patel, female    DOB: [birth year]  Age: 47 y.o. MRN: [id number]  CC: No chief complaint on file.   HPI Yolanda Patel presents to establish care and for review of medical issues. Patient reports that she is having hot flashes and other perimenopausal sx.    Outpatient Encounter Medications as of 01/26/2023  Medication Sig   Azelastine HCl (ASTEPRO) 0.15 % SOLN    [DISCONTINUED] phentermine (ADIPEX-P) 37.5 MG tablet Take 1 tablet every day by oral route.   phentermine (ADIPEX-P) 37.5 MG tablet Take 1 tablet every day by oral route.   triamcinolone cream (KENALOG) 0.1 % Apply 1 Application topically 2 (two) times daily. Apply to affected area(s) twice daily , do not apply to face.  Discontinue use after 5 days.   [DISCONTINUED] calcium carbonate (TUMS - DOSED IN MG ELEMENTAL CALCIUM) 500 MG chewable tablet Chew 2 tablets by mouth as needed for indigestion or heartburn.   [DISCONTINUED] fexofenadine (ALLEGRA) 180 MG tablet Take 180 mg by mouth daily as needed for allergies.   [DISCONTINUED] ibuprofen (ADVIL,MOTRIN) 800 MG tablet Take 1 tablet (800 mg total) by mouth every 8 (eight) hours as needed.   No facility-administered encounter medications on file as of 01/26/2023.    Past Medical History:  Diagnosis Date   Anxiety    panic attacks - no med   GERD (gastroesophageal reflux disease)    otc med - tums prn   Seasonal allergies    SVD (spontaneous vaginal delivery)    x 2 - both in [place]    Past Surgical History:  Procedure Laterality Date   LAPAROSCOPIC SALPINGO OOPHERECTOMY Left 04/19/2018   Procedure: LAPAROSCOPIC LEFT SALPINGO OOPHORECTOMY;  Surgeon: Gerald Leitz, MD;  Location: [facility];  Service: Gynecology;  Laterality: Left;   NO PAST SURGERIES      Family History  Problem Relation Age of Onset   Lupus Mother    Diabetes Maternal Grandfather    Diabetes Paternal Grandmother     Social History   Socioeconomic  History   Marital status: Married    Spouse name: Not on file   Number of children: Not on file   Years of education: Not on file   Highest education level: Not on file  Occupational History   Not on file  Tobacco Use   Smoking status: Never   Smokeless tobacco: Never  Vaping Use   Vaping Use: Never used  Substance and Sexual Activity   Alcohol use: Yes    Comment: occasional   Drug use: Never   Sexual activity: Yes    Birth control/protection: Pill  Other Topics Concern   Not on file  Social History Narrative   Not on file   Social Determinants of Health   Financial Resource Strain: Not on file  Food Insecurity: Not on file  Transportation Needs: Not on file  Physical Activity: Not on file  Stress: Not on file  Social Connections: Not on file  Intimate Partner Violence: Not on file    Review of Systems  All other systems reviewed and are negative.       Objective    BP 132/84   Pulse 91   Temp 99.2 F (37.3 C) (Oral)   Resp 16   Ht 5\' 3"  (1.6 m)   Wt 168 lb (76.2 kg)   SpO2 98%   BMI 29.76 kg/m   Physical Exam Vitals and nursing note  reviewed.  Constitutional:      General: She is not in acute distress. Cardiovascular:     Rate and Rhythm: Normal rate and regular rhythm.  Pulmonary:     Effort: Pulmonary effort is normal.     Breath sounds: Normal breath sounds.  Abdominal:     Palpations: Abdomen is soft.     Tenderness: There is no abdominal tenderness.  Neurological:     General: No focal deficit present.     Mental Status: She is alert and oriented to person, place, and time.         Assessment & Plan:  1. Hyperlipidemia, unspecified hyperlipidemia type Continue   2. Encounter for weight management Phentermine prescribed  3. Perimenopausal Discussed various dietary and activity options. Patient wants to try OTC Estroven for symptomatic relief. Reassurance given  4. Encounter to establish care       Return if symptoms  worsen or fail to improve.   Tommie Raymond, MD

## 2023-02-01 ENCOUNTER — Encounter: Payer: Self-pay | Admitting: Family Medicine

## 2023-03-10 ENCOUNTER — Encounter: Payer: Self-pay | Admitting: Family Medicine

## 2023-03-10 ENCOUNTER — Ambulatory Visit (INDEPENDENT_AMBULATORY_CARE_PROVIDER_SITE_OTHER): Payer: BC Managed Care – PPO | Admitting: Family Medicine

## 2023-03-10 VITALS — BP 123/75 | HR 90 | Temp 98.1°F | Resp 16 | Wt 156.6 lb

## 2023-03-10 DIAGNOSIS — Z6827 Body mass index (BMI) 27.0-27.9, adult: Secondary | ICD-10-CM

## 2023-03-10 DIAGNOSIS — Z7689 Persons encountering health services in other specified circumstances: Secondary | ICD-10-CM

## 2023-03-10 DIAGNOSIS — E663 Overweight: Secondary | ICD-10-CM | POA: Diagnosis not present

## 2023-03-10 MED ORDER — PHENTERMINE HCL 37.5 MG PO TABS: ORAL_TABLET | ORAL | 0 refills | Status: DC

## 2023-03-10 NOTE — Progress Notes (Signed)
Patient came in for monthly weight check. Patient has no other concerns today  

## 2023-03-10 NOTE — Progress Notes (Signed)
Established Patient Office Visit  Subjective    Patient ID: Yolanda Patel, female    DOB: [birth year]  Age: 47 y.o. MRN: [id number]  CC: No chief complaint on file.   HPI Yolanda Patel presents for routine weight check. Patient denies acute complaints or concerns.    Outpatient Encounter Medications as of 03/10/2023  Medication Sig   Azelastine HCl (ASTEPRO) 0.15 % SOLN    triamcinolone cream (KENALOG) 0.1 % Apply 1 Application topically 2 (two) times daily. Apply to affected area(s) twice daily , do not apply to face.  Discontinue use after 5 days.   [DISCONTINUED] phentermine (ADIPEX-P) 37.5 MG tablet Take 1 tablet every day by oral route.   phentermine (ADIPEX-P) 37.5 MG tablet Take 1 tablet every day by oral route.   No facility-administered encounter medications on file as of 03/10/2023.    Past Medical History:  Diagnosis Date   Anxiety    panic attacks - no med   GERD (gastroesophageal reflux disease)    otc med - tums prn   Seasonal allergies    SVD (spontaneous vaginal delivery)    x 2 - both in [place]    Past Surgical History:  Procedure Laterality Date   LAPAROSCOPIC SALPINGO OOPHERECTOMY Left 04/19/2018   Procedure: LAPAROSCOPIC LEFT SALPINGO OOPHORECTOMY;  Surgeon: Gerald Leitz, MD;  Location: [facility];  Service: Gynecology;  Laterality: Left;   NO PAST SURGERIES      Family History  Problem Relation Age of Onset   Lupus Mother    Diabetes Maternal Grandfather    Diabetes Paternal Grandmother     Social History   Socioeconomic History   Marital status: Married    Spouse name: Not on file   Number of children: Not on file   Years of education: Not on file   Highest education level: Not on file  Occupational History   Not on file  Tobacco Use   Smoking status: Never   Smokeless tobacco: Never  Vaping Use   Vaping Use: Never used  Substance and Sexual Activity   Alcohol use: Yes    Comment: occasional   Drug use: Never   Sexual activity: Yes    Birth  control/protection: Pill  Other Topics Concern   Not on file  Social History Narrative   Not on file   Social Determinants of Health   Financial Resource Strain: Not on file  Food Insecurity: Not on file  Transportation Needs: Not on file  Physical Activity: Not on file  Stress: Not on file  Social Connections: Not on file  Intimate Partner Violence: Not on file    Review of Systems  All other systems reviewed and are negative.       Objective    BP 123/75   Pulse 90   Temp 98.1 F (36.7 C) (Oral)   Resp 16   Wt 156 lb 9.6 oz (71 kg)   SpO2 98%   BMI 27.74 kg/m   Physical Exam Vitals and nursing note reviewed.  Constitutional:      General: She is not in acute distress. Cardiovascular:     Rate and Rhythm: Normal rate and regular rhythm.  Pulmonary:     Effort: Pulmonary effort is normal.     Breath sounds: Normal breath sounds.  Neurological:     General: No focal deficit present.     Mental Status: She is alert and oriented to person, place, and time.  Assessment & Plan:   1. Encounter for weight management Doing well with present management. Phentermine refilled.   2. Overweight (BMI 25.0-29.9)     Return in about 4 weeks (around 04/07/2023) for follow up.  Tommie Raymond, MD

## 2023-04-11 ENCOUNTER — Ambulatory Visit (INDEPENDENT_AMBULATORY_CARE_PROVIDER_SITE_OTHER): Payer: BC Managed Care – PPO | Admitting: Family Medicine

## 2023-04-11 VITALS — BP 127/84 | HR 87 | Temp 98.1°F | Resp 16 | Wt 151.4 lb

## 2023-04-11 DIAGNOSIS — Z6826 Body mass index (BMI) 26.0-26.9, adult: Secondary | ICD-10-CM

## 2023-04-11 DIAGNOSIS — Z7689 Persons encountering health services in other specified circumstances: Secondary | ICD-10-CM

## 2023-04-11 DIAGNOSIS — E663 Overweight: Secondary | ICD-10-CM

## 2023-04-11 MED ORDER — PHENTERMINE HCL 37.5 MG PO TABS: ORAL_TABLET | ORAL | 0 refills | Status: AC

## 2023-04-11 NOTE — Progress Notes (Unsigned)
Patient came in for monthly weight check. Patient has no other concerns today

## 2023-04-13 ENCOUNTER — Encounter: Payer: Self-pay | Admitting: Family Medicine

## 2023-04-13 NOTE — Progress Notes (Signed)
Established Patient Office Visit  Subjective    Patient ID: Yolanda Patel, female    DOB: 04/11/1976  Age: 47 y.o. MRN: [id number]  CC: No chief complaint on file.   HPI Yolanda Patel presents for weight management. Patient denies acute complaints or concerns    Outpatient Encounter Medications as of 04/11/2023  Medication Sig   [DISCONTINUED] phentermine (ADIPEX-P) 37.5 MG tablet Take 1 tablet every day by oral route.   Azelastine HCl (ASTEPRO) 0.15 % SOLN  (Patient not taking: Reported on 04/11/2023)   phentermine (ADIPEX-P) 37.5 MG tablet Take 1 tablet every day by oral route.   triamcinolone cream (KENALOG) 0.1 % Apply 1 Application topically 2 (two) times daily. Apply to affected area(s) twice daily , do not apply to face.  Discontinue use after 5 days. (Patient not taking: Reported on 04/11/2023)   No facility-administered encounter medications on file as of 04/11/2023.    Past Medical History:  Diagnosis Date   Anxiety    panic attacks - no med   GERD (gastroesophageal reflux disease)    otc med - tums prn   Seasonal allergies    SVD (spontaneous vaginal delivery)    x 2 - both in [place]    Past Surgical History:  Procedure Laterality Date   LAPAROSCOPIC SALPINGO OOPHERECTOMY Left 04/19/2018   Procedure: LAPAROSCOPIC LEFT SALPINGO OOPHORECTOMY;  Surgeon: Gerald Leitz, MD;  Location: [facility];  Service: Gynecology;  Laterality: Left;   NO PAST SURGERIES      Family History  Problem Relation Age of Onset   Lupus Mother    Diabetes Maternal Grandfather    Diabetes Paternal Grandmother     Social History   Socioeconomic History   Marital status: Married    Spouse name: Not on file   Number of children: Not on file   Years of education: Not on file   Highest education level: Not on file  Occupational History   Not on file  Tobacco Use   Smoking status: Never   Smokeless tobacco: Never  Vaping Use   Vaping status: Never Used  Substance and Sexual Activity    Alcohol use: Yes    Comment: occasional   Drug use: Never   Sexual activity: Yes    Birth control/protection: Pill  Other Topics Concern   Not on file  Social History Narrative   Not on file   Social Determinants of Health   Financial Resource Strain: Not on file  Food Insecurity: Not on file  Transportation Needs: Not on file  Physical Activity: Not on file  Stress: Not on file  Social Connections: Not on file  Intimate Partner Violence: Not on file    Review of Systems  All other systems reviewed and are negative.       Objective    BP 127/84   Pulse 87   Temp 98.1 F (36.7 C) (Oral)   Resp 16   Wt 151 lb 6.4 oz (68.7 kg)   SpO2 97%   BMI 26.82 kg/m   Physical Exam Vitals and nursing note reviewed.  Constitutional:      General: She is not in acute distress. Cardiovascular:     Rate and Rhythm: Normal rate and regular rhythm.  Pulmonary:     Effort: Pulmonary effort is normal.     Breath sounds: Normal breath sounds.  Neurological:     General: No focal deficit present.     Mental Status: She is alert and oriented to person,  place, and time.         Assessment & Plan:   1. Encounter for weight management Doing well. Phentermine refilled.   2. Overweight (BMI 25.0-29.9)     No follow-ups on file.   Tommie Raymond, MD

## 2023-05-13 ENCOUNTER — Encounter: Payer: Self-pay | Admitting: Family Medicine

## 2023-05-13 ENCOUNTER — Ambulatory Visit: Payer: BC Managed Care – PPO | Admitting: Family Medicine

## 2023-05-13 VITALS — BP 116/81 | HR 112 | Temp 98.1°F | Resp 16 | Wt 147.4 lb

## 2023-05-13 DIAGNOSIS — Z6821 Body mass index (BMI) 21.0-21.9, adult: Secondary | ICD-10-CM | POA: Diagnosis not present

## 2023-05-13 DIAGNOSIS — Z7689 Persons encountering health services in other specified circumstances: Secondary | ICD-10-CM | POA: Diagnosis not present

## 2023-05-13 DIAGNOSIS — E663 Overweight: Secondary | ICD-10-CM

## 2023-05-13 NOTE — Progress Notes (Signed)
Patient came in for monthly weight check. Patient has no other concerns today

## 2023-05-13 NOTE — Progress Notes (Signed)
Established Patient Office Visit  Subjective    Patient ID: Yolanda Patel, female    DOB: [birth year]  Age: 47 y.o. MRN: [id number]  CC:  Chief Complaint  Patient presents with   Weight Check    HPI Yolanda Patel presents for weight management. Patient denies acute complaints.    Outpatient Encounter Medications as of 05/13/2023  Medication Sig   Azelastine HCl (ASTEPRO) 0.15 % SOLN    phentermine (ADIPEX-P) 37.5 MG tablet Take 1 tablet every day by oral route.   triamcinolone cream (KENALOG) 0.1 % Apply 1 Application topically 2 (two) times daily. Apply to affected area(s) twice daily , do not apply to face.  Discontinue use after 5 days.   No facility-administered encounter medications on file as of 05/13/2023.    Past Medical History:  Diagnosis Date   Anxiety    panic attacks - no med   GERD (gastroesophageal reflux disease)    otc med - tums prn   Seasonal allergies    SVD (spontaneous vaginal delivery)    x 2 - both in [place]    Past Surgical History:  Procedure Laterality Date   LAPAROSCOPIC SALPINGO OOPHERECTOMY Left 04/19/2018   Procedure: LAPAROSCOPIC LEFT SALPINGO OOPHORECTOMY;  Surgeon: Gerald Leitz, MD;  Location: [facility];  Service: Gynecology;  Laterality: Left;   NO PAST SURGERIES      Family History  Problem Relation Age of Onset   Lupus Mother    Diabetes Maternal Grandfather    Diabetes Paternal Grandmother     Social History   Socioeconomic History   Marital status: Married    Spouse name: Not on file   Number of children: Not on file   Years of education: Not on file   Highest education level: Not on file  Occupational History   Not on file  Tobacco Use   Smoking status: Never   Smokeless tobacco: Never  Vaping Use   Vaping status: Never Used  Substance and Sexual Activity   Alcohol use: Yes    Comment: occasional   Drug use: Never   Sexual activity: Yes    Birth control/protection: Pill  Other Topics Concern   Not on file  Social  History Narrative   Not on file   Social Determinants of Health   Financial Resource Strain: Not on file  Food Insecurity: Not on file  Transportation Needs: Not on file  Physical Activity: Not on file  Stress: Not on file  Social Connections: Not on file  Intimate Partner Violence: Not on file    Review of Systems  All other systems reviewed and are negative.       Objective    BP 116/81   Pulse (!) 112   Temp 98.1 F (36.7 C) (Oral)   Resp 16   Wt 147 lb 6.4 oz (66.9 kg)   SpO2 98%   BMI 26.11 kg/m   Physical Exam Vitals and nursing note reviewed.  Constitutional:      General: She is not in acute distress. Cardiovascular:     Rate and Rhythm: Normal rate and regular rhythm.  Pulmonary:     Effort: Pulmonary effort is normal.     Breath sounds: Normal breath sounds.  Neurological:     General: No focal deficit present.     Mental Status: She is alert and oriented to person, place, and time.         Assessment & Plan:   1. Encounter for weight management Patient has  reached her goal. She will return for follow up in several months. Reviewed dietary and activity options.   2. Overweight (BMI 25.0-29.9)     Return in about 5 months (around 10/13/2023).   Tommie Raymond, MD

## 2023-07-11 ENCOUNTER — Other Ambulatory Visit: Payer: Self-pay | Admitting: Obstetrics and Gynecology

## 2023-07-11 DIAGNOSIS — Z1231 Encounter for screening mammogram for malignant neoplasm of breast: Secondary | ICD-10-CM

## 2023-08-29 ENCOUNTER — Ambulatory Visit
Admission: RE | Admit: 2023-08-29 | Discharge: 2023-08-29 | Disposition: A | Discharge status: Home / Self Care | Payer: BC Managed Care – PPO | Source: Ambulatory Visit | Attending: Obstetrics and Gynecology | Admitting: Obstetrics and Gynecology

## 2023-08-29 DIAGNOSIS — Z1231 Encounter for screening mammogram for malignant neoplasm of breast: Secondary | ICD-10-CM

## 2023-09-01 ENCOUNTER — Other Ambulatory Visit: Payer: Self-pay | Admitting: Obstetrics and Gynecology

## 2023-09-01 DIAGNOSIS — R928 Other abnormal and inconclusive findings on diagnostic imaging of breast: Secondary | ICD-10-CM

## 2023-10-03 ENCOUNTER — Ambulatory Visit
Admission: RE | Admit: 2023-10-03 | Discharge: 2023-10-03 | Disposition: A | Discharge status: Home / Self Care | Payer: 59 | Source: Ambulatory Visit | Attending: Obstetrics and Gynecology | Admitting: Obstetrics and Gynecology

## 2023-10-03 ENCOUNTER — Ambulatory Visit
Admission: RE | Admit: 2023-10-03 | Discharge: 2023-10-03 | Disposition: A | Discharge status: Home / Self Care | Payer: BC Managed Care – PPO | Source: Ambulatory Visit | Attending: Obstetrics and Gynecology | Admitting: Obstetrics and Gynecology

## 2023-10-03 DIAGNOSIS — R928 Other abnormal and inconclusive findings on diagnostic imaging of breast: Secondary | ICD-10-CM

## 2023-10-13 ENCOUNTER — Ambulatory Visit: Payer: Self-pay | Admitting: Family Medicine

## 2023-11-01 ENCOUNTER — Ambulatory Visit (INDEPENDENT_AMBULATORY_CARE_PROVIDER_SITE_OTHER): Payer: 59 | Admitting: Family Medicine

## 2023-11-01 ENCOUNTER — Ambulatory Visit: Payer: BC Managed Care – PPO | Admitting: Family Medicine

## 2023-11-01 VITALS — BP 126/78 | HR 88 | Temp 98.8°F | Resp 16 | Ht 63.0 in | Wt 153.0 lb

## 2023-11-01 DIAGNOSIS — Z130 Encounter for screening for diseases of the blood and blood-forming organs and certain disorders involving the immune mechanism: Secondary | ICD-10-CM | POA: Diagnosis not present

## 2023-11-01 DIAGNOSIS — R7303 Prediabetes: Secondary | ICD-10-CM

## 2023-11-01 DIAGNOSIS — Z0000 Encounter for general adult medical examination without abnormal findings: Secondary | ICD-10-CM

## 2023-11-01 DIAGNOSIS — Z13220 Encounter for screening for lipoid disorders: Secondary | ICD-10-CM | POA: Diagnosis not present

## 2023-11-02 ENCOUNTER — Encounter: Payer: Self-pay | Admitting: Family Medicine

## 2023-11-02 ENCOUNTER — Encounter: Payer: BC Managed Care – PPO | Admitting: Family Medicine

## 2023-11-02 LAB — CBC WITH DIFFERENTIAL/PLATELET
Basophils Absolute: 0 10*3/uL (ref 0.0–0.2)
Basos: 1 %
EOS (ABSOLUTE): 0.2 10*3/uL (ref 0.0–0.4)
Eos: 3 %
Hematocrit: 42.3 % (ref 34.0–46.6)
Hemoglobin: 13.6 g/dL (ref 11.1–15.9)
Immature Grans (Abs): 0 10*3/uL (ref 0.0–0.1)
Immature Granulocytes: 0 %
Lymphocytes Absolute: 3.1 10*3/uL (ref 0.7–3.1)
Lymphs: 44 %
MCH: 29.4 pg (ref 26.6–33.0)
MCHC: 32.2 g/dL (ref 31.5–35.7)
MCV: 91 fL (ref 79–97)
Monocytes Absolute: 0.5 10*3/uL (ref 0.1–0.9)
Monocytes: 7 %
Neutrophils Absolute: 3.2 10*3/uL (ref 1.4–7.0)
Neutrophils: 45 %
Platelets: 347 10*3/uL (ref 150–450)
RBC: 4.63 x10E6/uL (ref 3.77–5.28)
RDW: 13.4 % (ref 11.7–15.4)
WBC: 7 10*3/uL (ref 3.4–10.8)

## 2023-11-02 LAB — CMP14+EGFR
ALT: 16 [IU]/L (ref 0–32)
AST: 23 [IU]/L (ref 0–40)
Albumin: 4.2 g/dL (ref 3.9–4.9)
Alkaline Phosphatase: 84 [IU]/L (ref 44–121)
BUN/Creatinine Ratio: 11 (ref 9–23)
BUN: 10 mg/dL (ref 6–24)
Bilirubin Total: <0.2 mg/dL (ref 0.0–1.2)
CO2: 23 mmol/L (ref 20–29)
Calcium: 9.2 mg/dL (ref 8.7–10.2)
Chloride: 103 mmol/L (ref 96–106)
Creatinine, Ser: 0.87 mg/dL (ref 0.57–1.00)
Globulin, Total: 3 g/dL (ref 1.5–4.5)
Glucose: 87 mg/dL (ref 70–99)
Potassium: 4.6 mmol/L (ref 3.5–5.2)
Sodium: 142 mmol/L (ref 134–144)
Total Protein: 7.2 g/dL (ref 6.0–8.5)
eGFR: 83 mL/min/{1.73_m2} (ref >59)

## 2023-11-02 LAB — LIPID PANEL
Chol/HDL Ratio: 5.2 {ratio} — ABNORMAL HIGH (ref 0.0–4.4)
Cholesterol, Total: 212 mg/dL — ABNORMAL HIGH (ref 100–199)
HDL: 41 mg/dL (ref >39)
LDL Chol Calc (NIH): 150 mg/dL — ABNORMAL HIGH (ref 0–99)
Triglycerides: 118 mg/dL (ref 0–149)
VLDL Cholesterol Cal: 21 mg/dL (ref 5–40)

## 2023-11-02 NOTE — Progress Notes (Signed)
 Established Patient Office Visit  Subjective    Patient ID: Yolanda Patel, female    DOB: 23-Jul-1976  Age: 48 y.o. MRN: [id number]  CC:  Chief Complaint  Patient presents with   Annual Exam    HPI Yolanda Patel presents for routine annual exam. Patient denies acute complaints or concerns.   Outpatient Encounter Medications as of 11/01/2023  Medication Sig   Azelastine HCl (ASTEPRO) 0.15 % SOLN  (Patient not taking: Reported on 11/01/2023)   phentermine  (ADIPEX-P ) 37.5 MG tablet Take 1 tablet every day by oral route. (Patient not taking: Reported on 11/01/2023)   triamcinolone  cream (KENALOG ) 0.1 % Apply 1 Application topically 2 (two) times daily. Apply to affected area(s) twice daily , do not apply to face.  Discontinue use after 5 days. (Patient not taking: Reported on 11/01/2023)   No facility-administered encounter medications on file as of 11/01/2023.    Past Medical History:  Diagnosis Date   Anxiety    panic attacks - no med   GERD (gastroesophageal reflux disease)    otc med - tums prn   Seasonal allergies    SVD (spontaneous vaginal delivery)    x 2 - both in [place]    Past Surgical History:  Procedure Laterality Date   LAPAROSCOPIC SALPINGO OOPHERECTOMY Left 04/19/2018   Procedure: LAPAROSCOPIC LEFT SALPINGO OOPHORECTOMY;  Surgeon: Rosalva Sawyer, MD;  Location: [facility];  Service: Gynecology;  Laterality: Left;   NO PAST SURGERIES      Family History  Problem Relation Age of Onset   Lupus Mother    Diabetes Maternal Grandfather    Diabetes Paternal Grandmother    BRCA 1/2 Neg Hx    Breast cancer Neg Hx     Social History   Socioeconomic History   Marital status: Married    Spouse name: Not on file   Number of children: Not on file   Years of education: Not on file   Highest education level: Doctorate  Occupational History   Not on file  Tobacco Use   Smoking status: Never   Smokeless tobacco: Never  Vaping Use   Vaping status: Never Used  Substance and Sexual  Activity   Alcohol use: Yes    Comment: occasional   Drug use: Never   Sexual activity: Yes    Birth control/protection: Pill  Other Topics Concern   Not on file  Social History Narrative   Not on file   Social Drivers of Health   Financial Resource Strain: Low Risk  (11/01/2023)   Overall Financial Resource Strain (CARDIA)    Difficulty of Paying Living Expenses: Not very hard  Food Insecurity: Food Insecurity Present (11/01/2023)   Hunger Vital Sign    Worried About Running Out of Food in the Last Year: Sometimes true    Ran Out of Food in the Last Year: Sometimes true  Transportation Needs: No Transportation Needs (11/01/2023)   PRAPARE - Administrator, Civil Service (Medical): No    Lack of Transportation (Non-Medical): No  Physical Activity: Sufficiently Active (11/01/2023)   Exercise Vital Sign    Days of Exercise per Week: 5 days    Minutes of Exercise per Session: 40 min  Stress: No Stress Concern Present (11/01/2023)   [employer] of Occupational Health - Occupational Stress Questionnaire    Feeling of Stress : Only a little  Social Connections: Socially Integrated (11/01/2023)   Social Connection and Isolation Panel [NHANES]    Frequency of Communication with  Friends and Family: Twice a week    Frequency of Social Gatherings with Friends and Family: Twice a week    Attends Religious Services: 1 to 4 times per year    Active Member of [employer] or Organizations: Yes    Attends Banker Meetings: 1 to 4 times per year    Marital Status: Married  Catering Manager Violence: Not At Risk (11/01/2023)   Humiliation, Afraid, Rape, and Kick questionnaire    Fear of Current or Ex-Partner: No    Emotionally Abused: No    Physically Abused: No    Sexually Abused: No    Review of Systems  All other systems reviewed and are negative.       Objective    BP 126/78 (BP Location: Right Arm, Patient Position: Sitting, Cuff Size: Normal)   Pulse 88   Temp  98.8 F (37.1 C) (Oral)   Resp 16   Ht 5' 3 (1.6 m)   Wt 153 lb (69.4 kg)   SpO2 98%   BMI 27.10 kg/m   Physical Exam Vitals and nursing note reviewed.  Constitutional:      General: She is not in acute distress. HENT:     Head: Normocephalic and atraumatic.     Right Ear: Tympanic membrane, ear canal and external ear normal.     Left Ear: Tympanic membrane, ear canal and external ear normal.     Nose: Nose normal.     Mouth/Throat:     Mouth: Mucous membranes are moist.     Pharynx: Oropharynx is clear.  Eyes:     Conjunctiva/sclera: Conjunctivae normal.     Pupils: Pupils are equal, round, and reactive to light.  Neck:     Thyroid: No thyromegaly.  Cardiovascular:     Rate and Rhythm: Normal rate and regular rhythm.     Heart sounds: Normal heart sounds. No murmur heard. Pulmonary:     Effort: Pulmonary effort is normal. No respiratory distress.     Breath sounds: Normal breath sounds.  Abdominal:     General: There is no distension.     Palpations: Abdomen is soft. There is no mass.     Tenderness: There is no abdominal tenderness.  Musculoskeletal:        General: Normal range of motion.     Cervical back: Normal range of motion and neck supple.  Skin:    General: Skin is warm and dry.  Neurological:     General: No focal deficit present.     Mental Status: She is alert and oriented to person, place, and time.  Psychiatric:        Mood and Affect: Mood normal.        Behavior: Behavior normal.         Assessment & Plan:   Annual physical exam -     CMP14+EGFR  Screening for lipid disorders -     Lipid panel  Screening for deficiency anemia -     CBC with Differential/Platelet  Prediabetes -     Hemoglobin A1c     No follow-ups on file.   Tanda Raguel SQUIBB, MD

## 2023-11-13 ENCOUNTER — Encounter: Payer: Self-pay | Admitting: Family Medicine

## 2023-11-14 ENCOUNTER — Other Ambulatory Visit: Payer: 59

## 2023-11-14 NOTE — Progress Notes (Signed)
Patient came in  for POTC A1C 5.9

## 2024-02-11 ENCOUNTER — Encounter: Payer: Self-pay | Admitting: Family Medicine

## 2024-02-15 ENCOUNTER — Other Ambulatory Visit: Payer: Self-pay | Admitting: Family Medicine

## 2024-02-15 MED ORDER — TRIAMCINOLONE ACETONIDE 0.1 % EX CREA: 1.0000 | TOPICAL_CREAM | Freq: Two times a day (BID) | CUTANEOUS | 0 refills | Status: AC

## 2024-04-16 ENCOUNTER — Ambulatory Visit: Payer: Self-pay

## 2024-04-16 NOTE — Telephone Encounter (Addendum)
 Copied from CRM [id number]. Topic: Clinical - Red Word Triage >> Apr 16, 2024  9:36 AM Donna BRAVO wrote: Red Word that prompted transfer to Nurse Triage: patient has painful urination, burning towards the end 3 or 4 days Answer Assessment - Initial Assessment Questions 1. SEVERITY: How bad is the pain?  (e.g., Scale 1-10; mild, moderate, or severe)     Rates pain a 5-6  3. PATTERN: Is pain present every time you urinate or just sometimes?      Most of the time she urinates 4. ONSET: When did the painful urination start?      3-4 days  5. FEVER: Do you have a fever? If Yes, ask: What is your temperature, how was it measured, and when did it start?     Denies 6. PAST UTI: Have you had a urine infection before? If Yes, ask: When was the last time? and What happened that time?      States she gets frequent UTIs, states it has been a couple of years  7. CAUSE: What do you think is causing the painful urination?  (e.g., UTI, scratch, Herpes sore)     UTI 8. OTHER SYMPTOMS: Do you have any other symptoms? (e.g., blood in urine, flank pain, genital sores, urgency, vaginal discharge)     Frequency, denies hematuria, denies flank pain, denies abdominal pain, states she is still able to empty bladder  Protocols used: Urination Pain - Female-A-AH     No availability in office today. Offered first available appointment (tomorrow morning).

## 2024-04-16 NOTE — Telephone Encounter (Signed)
 Noted. Patient has appointment with provider tomorrow

## 2024-04-16 NOTE — Telephone Encounter (Signed)
 Reason for Disposition . All other patients with painful urination  (Exception: [1] EITHER frequency or urgency AND [2] has on-call doctor.)  Protocols used: Urination Pain - Female-A-AH

## 2024-04-17 ENCOUNTER — Ambulatory Visit (INDEPENDENT_AMBULATORY_CARE_PROVIDER_SITE_OTHER): Payer: Self-pay | Admitting: Family Medicine

## 2024-04-17 VITALS — BP 126/87 | HR 75 | Ht 63.0 in | Wt 150.8 lb

## 2024-04-17 DIAGNOSIS — R300 Dysuria: Secondary | ICD-10-CM | POA: Diagnosis not present

## 2024-04-17 MED ORDER — NITROFURANTOIN MONOHYD MACRO 100 MG PO CAPS: 100.0000 mg | ORAL_CAPSULE | Freq: Two times a day (BID) | ORAL | 0 refills | Status: DC

## 2024-04-17 NOTE — Progress Notes (Unsigned)
 Established Patient Office Visit  Subjective    Patient ID: Yolanda Patel, female    DOB: [birth year]  Age: 48 y.o. MRN: [id number]  CC:  Chief Complaint  Patient presents with   Urinary Frequency    Painful urination and frequency     HPI Yolanda Patel presents for complaint of dysuria. She just came back from a vacation. She denies fever/chills.   Outpatient Encounter Medications as of 04/17/2024  Medication Sig   nitrofurantoin , macrocrystal-monohydrate, (MACROBID ) 100 MG capsule Take 1 capsule (100 mg total) by mouth 2 (two) times daily.   triamcinolone  cream (KENALOG ) 0.1 % Apply 1 Application topically 2 (two) times daily. Apply to affected area(s) twice daily , do not apply to face.  Discontinue use after 5 days.   Azelastine HCl (ASTEPRO) 0.15 % SOLN  (Patient not taking: Reported on 04/17/2024)   phentermine  (ADIPEX-P ) 37.5 MG tablet Take 1 tablet every day by oral route. (Patient not taking: Reported on 04/17/2024)   No facility-administered encounter medications on file as of 04/17/2024.    Past Medical History:  Diagnosis Date   Anxiety    panic attacks - no med   GERD (gastroesophageal reflux disease)    otc med - tums prn   Seasonal allergies    SVD (spontaneous vaginal delivery)    x 2 - both in [place]    Past Surgical History:  Procedure Laterality Date   LAPAROSCOPIC SALPINGO OOPHERECTOMY Left 04/19/2018   Procedure: LAPAROSCOPIC LEFT SALPINGO OOPHORECTOMY;  Surgeon: Rosalva Sawyer, MD;  Location: [facility];  Service: Gynecology;  Laterality: Left;   NO PAST SURGERIES      Family History  Problem Relation Age of Onset   Lupus Mother    Diabetes Maternal Grandfather    Diabetes Paternal Grandmother    BRCA 1/2 Neg Hx    Breast cancer Neg Hx     Social History   Socioeconomic History   Marital status: Married    Spouse name: Not on file   Number of children: Not on file   Years of education: Not on file   Highest education level: Doctorate  Occupational  History   Not on file  Tobacco Use   Smoking status: Never   Smokeless tobacco: Never  Vaping Use   Vaping status: Never Used  Substance and Sexual Activity   Alcohol use: Yes    Comment: occasional   Drug use: Never   Sexual activity: Yes    Birth control/protection: Pill  Other Topics Concern   Not on file  Social History Narrative   Not on file   Social Drivers of Health   Financial Resource Strain: Low Risk  (11/01/2023)   Overall Financial Resource Strain (CARDIA)    Difficulty of Paying Living Expenses: Not very hard  Food Insecurity: Food Insecurity Present (11/01/2023)   Hunger Vital Sign    Worried About Running Out of Food in the Last Year: Sometimes true    Ran Out of Food in the Last Year: Sometimes true  Transportation Needs: No Transportation Needs (11/01/2023)   PRAPARE - Administrator, Civil Service (Medical): No    Lack of Transportation (Non-Medical): No  Physical Activity: Sufficiently Active (11/01/2023)   Exercise Vital Sign    Days of Exercise per Week: 5 days    Minutes of Exercise per Session: 40 min  Stress: No Stress Concern Present (11/01/2023)   [employer] of Occupational Health - Occupational Stress Questionnaire    Feeling of  Stress : Only a little  Social Connections: Socially Integrated (11/01/2023)   Social Connection and Isolation Panel    Frequency of Communication with Friends and Family: Twice a week    Frequency of Social Gatherings with Friends and Family: Twice a week    Attends Religious Services: 1 to 4 times per year    Active Member of [employer] or Organizations: Yes    Attends Banker Meetings: 1 to 4 times per year    Marital Status: Married  Catering manager Violence: Not At Risk (11/01/2023)   Humiliation, Afraid, Rape, and Kick questionnaire    Fear of Current or Ex-Partner: No    Emotionally Abused: No    Physically Abused: No    Sexually Abused: No    Review of Systems  Genitourinary:  Positive for  dysuria.  All other systems reviewed and are negative.       Objective    BP 126/87   Pulse 75   Ht 5' 3 (1.6 m)   Wt 150 lb 12.8 oz (68.4 kg)   LMP 04/03/2024 (Exact Date)   SpO2 97%   BMI 26.71 kg/m   Physical Exam Vitals and nursing note reviewed.  Constitutional:      General: She is not in acute distress. Cardiovascular:     Rate and Rhythm: Normal rate and regular rhythm.  Pulmonary:     Effort: Pulmonary effort is normal.     Breath sounds: Normal breath sounds.  Abdominal:     Palpations: Abdomen is soft.     Tenderness: There is no abdominal tenderness.  Neurological:     General: No focal deficit present.     Mental Status: She is alert and oriented to person, place, and time.         Assessment & Plan:   Dysuria -     POCT URINALYSIS DIP (CLINITEK)  Other orders -     Nitrofurantoin  Monohyd Macro; Take 1 capsule (100 mg total) by mouth 2 (two) times daily.  Dispense: 10 capsule; Refill: 0     Return if symptoms worsen or fail to improve.   Tanda Raguel SQUIBB, MD

## 2024-04-18 ENCOUNTER — Encounter: Payer: Self-pay | Admitting: Family Medicine

## 2024-04-18 ENCOUNTER — Ambulatory Visit

## 2024-04-18 LAB — POCT URINALYSIS DIP (CLINITEK)
Bilirubin, UA: NEGATIVE
Glucose, UA: NEGATIVE mg/dL
Ketones, POC UA: NEGATIVE mg/dL
Leukocytes, UA: NEGATIVE
Nitrite, UA: NEGATIVE
POC PROTEIN,UA: NEGATIVE
Spec Grav, UA: 1.025 (ref 1.010–1.025)
Urobilinogen, UA: 0.2 U/dL
pH, UA: 6.5 (ref 5.0–8.0)

## 2024-04-18 NOTE — Addendum Note (Signed)
 Addended by: Tiffiney Sparrow E on: 04/18/2024 02:03 PM   Modules accepted: Orders

## 2024-08-28 ENCOUNTER — Other Ambulatory Visit: Payer: Self-pay | Admitting: Obstetrics and Gynecology

## 2024-08-28 DIAGNOSIS — Z1231 Encounter for screening mammogram for malignant neoplasm of breast: Secondary | ICD-10-CM

## 2024-09-09 ENCOUNTER — Encounter: Payer: Self-pay | Admitting: Family Medicine

## 2024-09-11 ENCOUNTER — Inpatient Hospital Stay
Admission: RE | Admit: 2024-09-11 | Discharge: 2024-09-11 | Attending: Obstetrics and Gynecology | Admitting: Obstetrics and Gynecology

## 2024-09-11 DIAGNOSIS — Z1231 Encounter for screening mammogram for malignant neoplasm of breast: Secondary | ICD-10-CM

## 2024-09-17 ENCOUNTER — Ambulatory Visit

## 2024-09-17 VITALS — BP 137/82 | HR 88 | Temp 98.4°F | Resp 16 | Ht 63.0 in | Wt 154.2 lb

## 2024-09-17 DIAGNOSIS — R309 Painful micturition, unspecified: Secondary | ICD-10-CM | POA: Diagnosis not present

## 2024-09-17 LAB — POCT URINALYSIS DIP (CLINITEK)
Bilirubin, UA: NEGATIVE
Glucose, UA: NEGATIVE mg/dL
Ketones, POC UA: NEGATIVE mg/dL
Nitrite, UA: NEGATIVE
POC PROTEIN,UA: NEGATIVE
Spec Grav, UA: 1.02
Urobilinogen, UA: 0.2 U/dL
pH, UA: 7

## 2024-09-17 MED ORDER — NITROFURANTOIN MONOHYD MACRO 100 MG PO CAPS: 100.0000 mg | ORAL_CAPSULE | Freq: Two times a day (BID) | ORAL | 0 refills | Status: AC

## 2024-09-17 NOTE — Progress Notes (Unsigned)
" ° ° ° °  Patient ID: Yolanda Patel, female    DOB: 29-May-1976  MRN: [id number]  CC: Urinary Tract Infection Cletus get checked to see if she has an UTI)   Subjective: Yolanda Patel is a 48 y.o. female with past medical history of *** who presents to clinic for 2 weeks ago, Burning sensation   Allergies[1]  ROS: Review of Systems Negative except as stated above  PHYSICAL EXAM: BP 137/82 (BP Location: Right Arm)   Pulse 88   Temp 98.4 F (36.9 C)   Resp 16   Ht 5' 3 (1.6 m)   Wt 154 lb 3.2 oz (69.9 kg)   LMP 09/14/2024 (Exact Date)   SpO2 94%   BMI 27.32 kg/m   Physical Exam  General: well-appearing, no acute distress Skin: no jaundice, rashes, or lesions Cardiovascular: regular heart rate and rhythm, normal S1/S2, no murmurs, gallops, or rubs, peripheral pulses 2+ bilaterally Chest: no skeletal deformity, lungs clear to auscultation bilaterally, equal breath sounds bilaterally Abdomen: soft, non-distended, non-tender to palpation, no hepatomegaly, no splenomegaly, normoactive bowel sounds Musculoskeletal: normal gait Extremities: no peripheral edema  ASSESSMENT AND PLAN:  There are no diagnoses linked to this encounter.   Patient was given the opportunity to ask questions.  Patient verbalized understanding of the plan and was able to repeat key elements of the plan.    No orders of the defined types were placed in this encounter.    Requested Prescriptions    No prescriptions requested or ordered in this encounter    No follow-ups on file.  Sula Cower Byren Pankow, PA-C      [1] No Known Allergies  "

## 2024-09-21 ENCOUNTER — Other Ambulatory Visit: Payer: Self-pay | Admitting: Nurse Practitioner

## 2024-09-21 DIAGNOSIS — N920 Excessive and frequent menstruation with regular cycle: Secondary | ICD-10-CM

## 2024-10-03 ENCOUNTER — Telehealth: Payer: Self-pay | Admitting: Family Medicine

## 2024-10-03 NOTE — Telephone Encounter (Signed)
 Pt wants advice on medication. Got prescribed antibiotics for dental care and just finished taking UTI medication 2 wks ago. Pt wants to know if it is Ok to start antibiotics. Pt wants a CB with advice.

## 2024-10-31 ENCOUNTER — Encounter: Payer: Self-pay | Admitting: Family Medicine

## 2024-10-31 ENCOUNTER — Ambulatory Visit: Payer: 59 | Admitting: Family Medicine

## 2024-10-31 VITALS — BP 119/81 | HR 97 | Ht 63.0 in | Wt 159.8 lb

## 2024-10-31 DIAGNOSIS — Z136 Encounter for screening for cardiovascular disorders: Secondary | ICD-10-CM

## 2024-10-31 DIAGNOSIS — Z0000 Encounter for general adult medical examination without abnormal findings: Secondary | ICD-10-CM | POA: Diagnosis not present

## 2024-10-31 DIAGNOSIS — Z130 Encounter for screening for diseases of the blood and blood-forming organs and certain disorders involving the immune mechanism: Secondary | ICD-10-CM

## 2024-10-31 DIAGNOSIS — Z13228 Encounter for screening for other metabolic disorders: Secondary | ICD-10-CM

## 2024-10-31 NOTE — Progress Notes (Signed)
 "  Established Patient Office Visit  Subjective    Patient ID: Yolanda Patel, female    DOB: Jun 17, 1976  Age: 49 y.o. MRN: [id number]  CC:  Chief Complaint  Patient presents with   Annual Exam    HPI Yolanda Patel presents for routine annual exam. Patient denies acute complaints.   Outpatient Encounter Medications as of 10/31/2024  Medication Sig   Azelastine HCl (ASTEPRO) 0.15 % SOLN    phentermine  (ADIPEX-P ) 37.5 MG tablet Take 1 tablet every day by oral route. (Patient not taking: Reported on 09/17/2024)   triamcinolone  cream (KENALOG ) 0.1 % Apply 1 Application topically 2 (two) times daily. Apply to affected area(s) twice daily , do not apply to face.  Discontinue use after 5 days. (Patient taking differently: Apply 1 Application topically 2 (two) times daily as needed. Apply to affected area(s) twice daily , do not apply to face.  Discontinue use after 5 days.)   No facility-administered encounter medications on file as of 10/31/2024.    Past Medical History:  Diagnosis Date   Anxiety    panic attacks - no med   GERD (gastroesophageal reflux disease)    otc med - tums prn   Seasonal allergies    SVD (spontaneous vaginal delivery)    x 2 - both in [place]    Past Surgical History:  Procedure Laterality Date   LAPAROSCOPIC SALPINGO OOPHERECTOMY Left 04/19/2018   Procedure: LAPAROSCOPIC LEFT SALPINGO OOPHORECTOMY;  Surgeon: Rosalva Sawyer, MD;  Location: [facility];  Service: Gynecology;  Laterality: Left;   NO PAST SURGERIES      Family History  Problem Relation Age of Onset   Lupus Mother    Diabetes Maternal Grandfather    Diabetes Paternal Grandmother    BRCA 1/2 Neg Hx    Breast cancer Neg Hx     Social History   Socioeconomic History   Marital status: Married    Spouse name: Not on file   Number of children: Not on file   Years of education: Not on file   Highest education level: Doctorate  Occupational History   Not on file  Tobacco Use   Smoking status: Never    Smokeless tobacco: Never  Vaping Use   Vaping status: Never Used  Substance and Sexual Activity   Alcohol use: Yes    Comment: occasional   Drug use: Never   Sexual activity: Yes    Birth control/protection: Pill  Other Topics Concern   Not on file  Social History Narrative   Not on file   Social Drivers of Health   Tobacco Use: Low Risk (10/31/2024)   Patient History    Smoking Tobacco Use: Never    Smokeless Tobacco Use: Never    Passive Exposure: Not on file  Financial Resource Strain: Low Risk (11/01/2023)   Overall Financial Resource Strain (CARDIA)    Difficulty of Paying Living Expenses: Not very hard  Food Insecurity: Food Insecurity Present (11/01/2023)   Hunger Vital Sign    Worried About Running Out of Food in the Last Year: Sometimes true    Ran Out of Food in the Last Year: Sometimes true  Transportation Needs: No Transportation Needs (11/01/2023)   PRAPARE - Administrator, Civil Service (Medical): No    Lack of Transportation (Non-Medical): No  Physical Activity: Sufficiently Active (11/01/2023)   Exercise Vital Sign    Days of Exercise per Week: 5 days    Minutes of Exercise per Session: 40 min  Stress: No Stress Concern Present (11/01/2023)   [employer] of Occupational Health - Occupational Stress Questionnaire    Feeling of Stress : Only a little  Social Connections: Socially Integrated (11/01/2023)   Social Connection and Isolation Panel    Frequency of Communication with Friends and Family: Twice a week    Frequency of Social Gatherings with Friends and Family: Twice a week    Attends Religious Services: 1 to 4 times per year    Active Member of [employer] or Organizations: Yes    Attends Banker Meetings: 1 to 4 times per year    Marital Status: Married  Catering Manager Violence: Not At Risk (11/01/2023)   Humiliation, Afraid, Rape, and Kick questionnaire    Fear of Current or Ex-Partner: No    Emotionally Abused: No    Physically  Abused: No    Sexually Abused: No  Depression (PHQ2-9): Low Risk (09/17/2024)   Depression (PHQ2-9)    PHQ-2 Score: 0  Alcohol Screen: Low Risk (11/01/2023)   Alcohol Screen    Last Alcohol Screening Score (AUDIT): 0  Housing: Low Risk (11/01/2023)   Housing Stability Vital Sign    Unable to Pay for Housing in the Last Year: No    Number of Times Moved in the Last Year: 0    Homeless in the Last Year: No  Utilities: Not At Risk (11/01/2023)   [facility] Utilities    Threatened with loss of utilities: No  Health Literacy: Adequate Health Literacy (11/01/2023)   B1300 Health Literacy    Frequency of need for help with medical instructions: Never    Review of Systems  All other systems reviewed and are negative.       Objective    BP 119/81   Pulse 97   Ht 5' 3 (1.6 m)   Wt 159 lb 12.8 oz (72.5 kg)   LMP 10/14/2024 (Exact Date)   SpO2 98%   BMI 28.31 kg/m   Physical Exam Vitals and nursing note reviewed.  Constitutional:      General: She is not in acute distress. HENT:     Head: Normocephalic and atraumatic.     Right Ear: Tympanic membrane, ear canal and external ear normal.     Left Ear: Tympanic membrane, ear canal and external ear normal.     Nose: Nose normal.     Mouth/Throat:     Mouth: Mucous membranes are moist.     Pharynx: Oropharynx is clear.  Eyes:     Conjunctiva/sclera: Conjunctivae normal.     Pupils: Pupils are equal, round, and reactive to light.  Neck:     Thyroid: No thyromegaly.  Cardiovascular:     Rate and Rhythm: Normal rate and regular rhythm.     Heart sounds: Normal heart sounds. No murmur heard. Pulmonary:     Effort: Pulmonary effort is normal. No respiratory distress.     Breath sounds: Normal breath sounds.  Abdominal:     General: There is no distension.     Palpations: Abdomen is soft. There is no mass.     Tenderness: There is no abdominal tenderness.  Musculoskeletal:        General: Normal range of motion.     Cervical back:  Normal range of motion and neck supple.  Skin:    General: Skin is warm and dry.  Neurological:     General: No focal deficit present.     Mental Status: She is alert and oriented to  person, place, and time.  Psychiatric:        Mood and Affect: Mood normal.        Behavior: Behavior normal.         Assessment & Plan:   Annual physical exam -     Comprehensive metabolic panel with GFR  Screening for deficiency anemia -     CBC with Differential/Platelet  Encounter for screening for cardiovascular disorders -     Lipid panel  Screening for endocrine/metabolic/immunity disorders -     Hemoglobin A1c -     VITAMIN D 25 Hydroxy (Vit-D Deficiency, Fractures)     No follow-ups on file.   Tanda Raguel SQUIBB, MD  "

## 2024-11-01 LAB — LIPID PANEL
Chol/HDL Ratio: 4.3 ratio (ref 0.0–4.4)
Cholesterol, Total: 191 mg/dL (ref 100–199)
HDL: 44 mg/dL
LDL Chol Calc (NIH): 130 mg/dL — ABNORMAL HIGH (ref 0–99)
Triglycerides: 95 mg/dL (ref 0–149)
VLDL Cholesterol Cal: 17 mg/dL (ref 5–40)

## 2024-11-01 LAB — CBC WITH DIFFERENTIAL/PLATELET
Basophils Absolute: 0.1 10*3/uL (ref 0.0–0.2)
Basos: 1 %
EOS (ABSOLUTE): 0.2 10*3/uL (ref 0.0–0.4)
Eos: 3 %
Hematocrit: 38.4 % (ref 34.0–46.6)
Hemoglobin: 12.1 g/dL (ref 11.1–15.9)
Immature Grans (Abs): 0 10*3/uL (ref 0.0–0.1)
Immature Granulocytes: 0 %
Lymphocytes Absolute: 3.3 10*3/uL — ABNORMAL HIGH (ref 0.7–3.1)
Lymphs: 42 %
MCH: 27.5 pg (ref 26.6–33.0)
MCHC: 31.5 g/dL (ref 31.5–35.7)
MCV: 87 fL (ref 79–97)
Monocytes Absolute: 0.7 10*3/uL (ref 0.1–0.9)
Monocytes: 9 %
Neutrophils Absolute: 3.7 10*3/uL (ref 1.4–7.0)
Neutrophils: 45 %
Platelets: 370 10*3/uL (ref 150–450)
RBC: 4.4 x10E6/uL (ref 3.77–5.28)
RDW: 14.5 % (ref 11.7–15.4)
WBC: 8 10*3/uL (ref 3.4–10.8)

## 2024-11-01 LAB — HEMOGLOBIN A1C
Est. average glucose Bld gHb Est-mCnc: 120 mg/dL
Hgb A1c MFr Bld: 5.8 % — ABNORMAL HIGH (ref 4.8–5.6)

## 2024-11-01 LAB — VITAMIN D 25 HYDROXY (VIT D DEFICIENCY, FRACTURES): Vit D, 25-Hydroxy: 32.6 ng/mL (ref 30.0–100.0)

## 2024-11-01 LAB — COMPREHENSIVE METABOLIC PANEL WITH GFR
ALT: 31 [IU]/L (ref 0–32)
AST: 33 [IU]/L (ref 0–40)
Albumin: 4 g/dL (ref 3.9–4.9)
Alkaline Phosphatase: 68 [IU]/L (ref 41–116)
BUN/Creatinine Ratio: 9 (ref 9–23)
BUN: 8 mg/dL (ref 6–24)
Bilirubin Total: 0.2 mg/dL (ref 0.0–1.2)
CO2: 22 mmol/L (ref 20–29)
Calcium: 9.3 mg/dL (ref 8.7–10.2)
Chloride: 104 mmol/L (ref 96–106)
Creatinine, Ser: 0.86 mg/dL (ref 0.57–1.00)
Globulin, Total: 3.1 g/dL (ref 1.5–4.5)
Glucose: 86 mg/dL (ref 70–99)
Potassium: 4.7 mmol/L (ref 3.5–5.2)
Sodium: 139 mmol/L (ref 134–144)
Total Protein: 7.1 g/dL (ref 6.0–8.5)
eGFR: 83 mL/min/{1.73_m2}

## 2024-11-02 ENCOUNTER — Ambulatory Visit: Payer: Self-pay | Admitting: Family Medicine

## 2025-11-04 ENCOUNTER — Encounter: Payer: Self-pay | Admitting: Family Medicine
